# Patient Record
Sex: Male | Born: 2000 | Race: Black or African American | Hispanic: No | Marital: Single | Smoking: Never smoker
Health system: Southern US, Community
[De-identification: ages and names within clinical notes are randomized; demographics above are authoritative.]

## PROBLEM LIST (undated history)

## (undated) DIAGNOSIS — J45909 Unspecified asthma, uncomplicated: Secondary | ICD-10-CM

## (undated) HISTORY — PX: OTHER SURGICAL HISTORY: SHX169

## (undated) HISTORY — PX: COSMETIC SURGERY: SHX468

## (undated) HISTORY — PX: EAR TUBE REMOVAL: SHX1486

## (undated) HISTORY — PX: TYMPANOSTOMY TUBE PLACEMENT: SHX32

## (undated) HISTORY — PX: TONSILLECTOMY: SUR1361

---
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
## 2019-01-20 ENCOUNTER — Ambulatory Visit (HOSPITAL_COMMUNITY)
Admission: EM | Admit: 2019-01-20 | Discharge: 2019-01-20 | Disposition: A | Discharge status: Home / Self Care | Payer: Medicaid Other | Attending: Family Medicine | Admitting: Family Medicine

## 2019-01-20 ENCOUNTER — Other Ambulatory Visit: Payer: Self-pay

## 2019-01-20 ENCOUNTER — Encounter (HOSPITAL_COMMUNITY): Payer: Self-pay | Admitting: Emergency Medicine

## 2019-01-20 DIAGNOSIS — M545 Low back pain, unspecified: Secondary | ICD-10-CM

## 2019-01-20 MED ORDER — CYCLOBENZAPRINE HCL 10 MG PO TABS: 10.0000 mg | ORAL_TABLET | Freq: Two times a day (BID) | ORAL | 0 refills | Status: DC | PRN

## 2019-01-20 NOTE — Discharge Instructions (Addendum)
Avoid heavy lifting and bending

## 2019-01-20 NOTE — ED Triage Notes (Addendum)
Patient presents to [facility] for assessment of lower back pain x 3 weeks.  States he began working at [employer] in early August, and thinks it might be that.  Patient states the first time he experience it was when he went to sit up from relaxing on the couch, and has felt it intermittently since then.

## 2019-01-20 NOTE — ED Provider Notes (Signed)
[facility]    CSN: [id number] Arrival date & time: 01/20/19  1826      History   Chief Complaint Chief Complaint  Patient presents with  . Back Pain    HPI Scott Sloan is a 18 y.o. male.   Patient had onset of low back pain after beginning current job at [employer].  He cannot relate any specific item he was lifting that caused the pain but is more an accumulative situation and gradual onset of low back pain.  Denies radiation to legs.  There is no prior history of back injury or back pain.  HPI  Past Medical History:  Diagnosis Date  . Asthma     There are no active problems to display for this patient.   Past Surgical History:  Procedure Laterality Date  . EAR TUBE REMOVAL         Home Medications    Prior to Admission medications   Medication Sig Start Date End Date Taking? Authorizing Provider  albuterol (PROVENTIL HFA;VENTOLIN HFA) 108 (90 Base) MCG/ACT inhaler Inhale 1-2 puffs into the lungs every 6 (six) hours as needed for wheezing or shortness of breath. 07/24/17   Azalia Bilis, MD  azithromycin (ZITHROMAX Z-PAK) 250 MG tablet Take 1 tablet (250 mg total) by mouth daily. Take 2 tabs for first dose, then 1 tab for each additional dose 07/24/17   Azalia Bilis, MD    Family History Family History  Problem Relation Age of Onset  . Heart attack Mother   . CVA Mother   . Diabetes type II Mother   . Hypertension Mother   . Pneumonia Father     Social History Social History   Tobacco Use  . Smoking status: Never Smoker  . Smokeless tobacco: Never Used  Substance Use Topics  . Alcohol use: No  . Drug use: No     Allergies   Amoxicillin   Review of Systems Review of Systems  Musculoskeletal: Positive for back pain. Negative for arthralgias and gait problem.  All other systems reviewed and are negative.    Physical Exam Triage Vital Signs ED Triage Vitals  Enc Vitals Group     BP 01/20/19 1935 128/75     Pulse Rate 01/20/19  1935 72     Resp 01/20/19 1935 16     Temp 01/20/19 1935 98.3 F (36.8 C)     Temp Source 01/20/19 1935 Tympanic     SpO2 01/20/19 1935 97 %     Weight --      Height --      Head Circumference --      Peak Flow --      Pain Score 01/20/19 1936 2     Pain Loc --      Pain Edu? --      Excl. in [place]? --    No data found.  Updated Vital Signs BP 128/75 (BP Location: Left Arm)   Pulse 72   Temp 98.3 F (36.8 C) (Tympanic)   Resp 16   SpO2 97%   Visual Acuity Right Eye Distance:   Left Eye Distance:   Bilateral Distance:    Right Eye Near:   Left Eye Near:    Bilateral Near:     Physical Exam Constitutional:      Appearance: Normal appearance. He is normal weight.  Cardiovascular:     Rate and Rhythm: Normal rate.  Pulmonary:     Effort: Pulmonary effort is normal.  Breath sounds: Normal breath sounds.  Abdominal:     General: Abdomen is flat. Bowel sounds are normal.  Musculoskeletal:     Comments: Back: Slight decrease in flexion and extension but lateral bending is normal. Straight leg raising is negative Deep tendon reflexes are symmetric in lower extremities Normal gait and toe walking  Neurological:     Mental Status: He is alert.      UC Treatments / Results  Labs (all labs ordered are listed, but only abnormal results are displayed) Labs Reviewed - No data to display  EKG   Radiology No results found.  Procedures Procedures (including critical care time)  Medications Ordered in UC Medications - No data to display  Initial Impression / Assessment and Plan / UC Course  I have reviewed the triage vital signs and the nursing notes.  Pertinent labs & imaging results that were available during my care of the patient were reviewed by me and considered in my medical decision making (see chart for details).     Mechanical low back strain.  Have advised him to decrease lifting and bending until problem resolves Final Clinical Impressions(s) /  UC Diagnoses   Final diagnoses:  None   Discharge Instructions   None    ED Prescriptions    None     PDMP not reviewed this encounter.   Wardell Honour, MD 01/20/19 [phone]

## 2019-07-17 ENCOUNTER — Ambulatory Visit (HOSPITAL_COMMUNITY)
Admission: EM | Admit: 2019-07-17 | Discharge: 2019-07-17 | Disposition: A | Discharge status: Home / Self Care | Payer: Medicaid Other | Attending: Family Medicine | Admitting: Family Medicine

## 2019-07-17 ENCOUNTER — Encounter (HOSPITAL_COMMUNITY): Payer: Self-pay | Admitting: *Deleted

## 2019-07-17 ENCOUNTER — Other Ambulatory Visit: Payer: Self-pay

## 2019-07-17 DIAGNOSIS — S6991XA Unspecified injury of right wrist, hand and finger(s), initial encounter: Secondary | ICD-10-CM

## 2019-07-17 DIAGNOSIS — Z23 Encounter for immunization: Secondary | ICD-10-CM | POA: Diagnosis not present

## 2019-07-17 DIAGNOSIS — S61232A Puncture wound without foreign body of right middle finger without damage to nail, initial encounter: Secondary | ICD-10-CM | POA: Diagnosis not present

## 2019-07-17 DIAGNOSIS — W540XXA Bitten by dog, initial encounter: Secondary | ICD-10-CM | POA: Diagnosis not present

## 2019-07-17 DIAGNOSIS — S61214A Laceration without foreign body of right ring finger without damage to nail, initial encounter: Secondary | ICD-10-CM | POA: Diagnosis not present

## 2019-07-17 MED ORDER — TETANUS-DIPHTH-ACELL PERTUSSIS 5-2.5-18.5 LF-MCG/0.5 IM SUSP
INTRAMUSCULAR | Status: AC
  Filled 2019-07-17: qty 0.5

## 2019-07-17 MED ORDER — TETANUS-DIPHTH-ACELL PERTUSSIS 5-2.5-18.5 LF-MCG/0.5 IM SUSP
0.5000 mL | Freq: Once | INTRAMUSCULAR | Status: AC
  Administered 2019-07-17: 19:00:00 0.5 mL via INTRAMUSCULAR

## 2019-07-17 MED ORDER — AMOXICILLIN-POT CLAVULANATE 875-125 MG PO TABS: 1.0000 | ORAL_TABLET | Freq: Two times a day (BID) | ORAL | 0 refills | Status: DC

## 2019-07-17 NOTE — Discharge Instructions (Addendum)
Treating prophylactically for infection with amoxicillin. Tetanus shot updated. Please follow-up for any continued or worsening problems.

## 2019-07-17 NOTE — ED Triage Notes (Signed)
Pt reports he reached under bed to get his dog out when it bit him @ 1300 today.  States dog is UTD on rabies vaccines.  Small laceration and puncture wound to anterior right middle finger, small superficial laceration to right anterior 4th finger and small avulsion to right 2nd finger.  All sites without any active bleeding.  Pt has cleansed all wounds with soap/water.
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
## 2019-11-02 ENCOUNTER — Encounter (HOSPITAL_COMMUNITY): Payer: Self-pay

## 2019-11-02 ENCOUNTER — Emergency Department (HOSPITAL_COMMUNITY)
Admission: EM | Admit: 2019-11-02 | Discharge: 2019-11-02 | Disposition: A | Discharge status: Home / Self Care | Payer: Medicaid Other | Attending: Emergency Medicine | Admitting: Emergency Medicine

## 2019-11-02 DIAGNOSIS — Y939 Activity, unspecified: Secondary | ICD-10-CM | POA: Insufficient documentation

## 2019-11-02 DIAGNOSIS — S01111A Laceration without foreign body of right eyelid and periocular area, initial encounter: Secondary | ICD-10-CM | POA: Diagnosis present

## 2019-11-02 DIAGNOSIS — W228XXA Striking against or struck by other objects, initial encounter: Secondary | ICD-10-CM | POA: Diagnosis not present

## 2019-11-02 DIAGNOSIS — Y999 Unspecified external cause status: Secondary | ICD-10-CM | POA: Insufficient documentation

## 2019-11-02 DIAGNOSIS — Z5321 Procedure and treatment not carried out due to patient leaving prior to being seen by health care provider: Secondary | ICD-10-CM | POA: Diagnosis not present

## 2019-11-02 DIAGNOSIS — Y929 Unspecified place or not applicable: Secondary | ICD-10-CM | POA: Diagnosis not present

## 2019-11-02 NOTE — ED Notes (Signed)
Pt called no answer 

## 2019-11-02 NOTE — ED Notes (Signed)
Pt did not answer x 2 
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
## 2019-11-04 ENCOUNTER — Emergency Department (HOSPITAL_COMMUNITY): Payer: Medicaid Other

## 2019-11-04 ENCOUNTER — Emergency Department (HOSPITAL_COMMUNITY)
Admission: EM | Admit: 2019-11-04 | Discharge: 2019-11-04 | Disposition: A | Discharge status: Home / Self Care | Payer: Medicaid Other | Attending: Emergency Medicine | Admitting: Emergency Medicine

## 2019-11-04 ENCOUNTER — Encounter (HOSPITAL_COMMUNITY): Payer: Self-pay

## 2019-11-04 DIAGNOSIS — Z7951 Long term (current) use of inhaled steroids: Secondary | ICD-10-CM | POA: Insufficient documentation

## 2019-11-04 DIAGNOSIS — Y939 Activity, unspecified: Secondary | ICD-10-CM | POA: Diagnosis not present

## 2019-11-04 DIAGNOSIS — Y999 Unspecified external cause status: Secondary | ICD-10-CM | POA: Diagnosis not present

## 2019-11-04 DIAGNOSIS — W228XXA Striking against or struck by other objects, initial encounter: Secondary | ICD-10-CM | POA: Insufficient documentation

## 2019-11-04 DIAGNOSIS — J45909 Unspecified asthma, uncomplicated: Secondary | ICD-10-CM | POA: Diagnosis not present

## 2019-11-04 DIAGNOSIS — S0990XA Unspecified injury of head, initial encounter: Secondary | ICD-10-CM | POA: Diagnosis present

## 2019-11-04 DIAGNOSIS — Y929 Unspecified place or not applicable: Secondary | ICD-10-CM | POA: Insufficient documentation

## 2019-11-04 DIAGNOSIS — S060X0A Concussion without loss of consciousness, initial encounter: Secondary | ICD-10-CM | POA: Diagnosis not present

## 2019-11-04 MED ORDER — ACETAMINOPHEN 325 MG PO TABS
650.0000 mg | ORAL_TABLET | Freq: Once | ORAL | Status: AC
  Administered 2019-11-04: 650 mg via ORAL
  Filled 2019-11-04: qty 2

## 2019-11-04 MED ORDER — SODIUM CHLORIDE 0.9 % IV BOLUS: 1000.0000 mL | Freq: Once | INTRAVENOUS | Status: DC
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
## 2019-11-04 NOTE — Discharge Instructions (Addendum)
You are seen today for concussion syndrome, I want you to use the attached instructions.  I want you to take Tylenol as prescribed on the bottle for pain.  If you start developing any severe headache, vision changes, weakness, trouble walking, difficulty urinating I want you to come back to the emergency department.  I want you to follow-up with your primary care in the next couple of days if you do not start to feel better.  I do not want you to drink alcohol over the next couple of days and avoid driving.  If you have any new or worsening concerning symptoms please come back to the emergency department.
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
## 2020-02-09 ENCOUNTER — Encounter (HOSPITAL_COMMUNITY): Payer: Self-pay | Admitting: *Deleted

## 2020-02-09 ENCOUNTER — Other Ambulatory Visit: Payer: Self-pay

## 2020-02-09 ENCOUNTER — Ambulatory Visit (HOSPITAL_COMMUNITY)
Admission: EM | Admit: 2020-02-09 | Discharge: 2020-02-09 | Disposition: A | Discharge status: Home / Self Care | Payer: Medicaid Other | Attending: Internal Medicine | Admitting: Internal Medicine

## 2020-02-09 ENCOUNTER — Telehealth (HOSPITAL_COMMUNITY): Payer: Self-pay | Admitting: Internal Medicine

## 2020-02-09 DIAGNOSIS — R5383 Other fatigue: Secondary | ICD-10-CM | POA: Insufficient documentation

## 2020-02-09 LAB — CBC WITH DIFFERENTIAL/PLATELET
Abs Immature Granulocytes: 0.02 10*3/uL (ref 0.00–0.07)
Basophils Absolute: 0 10*3/uL (ref 0.0–0.1)
Basophils Relative: 0 %
Eosinophils Absolute: 0.1 10*3/uL (ref 0.0–0.5)
Eosinophils Relative: 2 %
HCT: 48.4 % (ref 39.0–52.0)
Hemoglobin: 15.7 g/dL (ref 13.0–17.0)
Immature Granulocytes: 0 %
Lymphocytes Relative: 18 %
Lymphs Abs: 1.5 10*3/uL (ref 0.7–4.0)
MCH: 29.5 pg (ref 26.0–34.0)
MCHC: 32.4 g/dL (ref 30.0–36.0)
MCV: 91 fL (ref 80.0–100.0)
Monocytes Absolute: 0.6 10*3/uL (ref 0.1–1.0)
Monocytes Relative: 7 %
Neutro Abs: 6.1 10*3/uL (ref 1.7–7.7)
Neutrophils Relative %: 73 %
Platelets: 363 10*3/uL (ref 150–400)
RBC: 5.32 MIL/uL (ref 4.22–5.81)
RDW: 13.2 % (ref 11.5–15.5)
WBC: 8.4 10*3/uL (ref 4.0–10.5)
nRBC: 0 % (ref 0.0–0.2)

## 2020-02-09 LAB — COMPREHENSIVE METABOLIC PANEL
ALT: 15 U/L (ref 0–44)
AST: 17 U/L (ref 15–41)
Albumin: 3.9 g/dL (ref 3.5–5.0)
Alkaline Phosphatase: 54 U/L (ref 38–126)
Anion gap: 9 (ref 5–15)
BUN: 5 mg/dL — ABNORMAL LOW (ref 6–20)
CO2: 25 mmol/L (ref 22–32)
Calcium: 9.1 mg/dL (ref 8.9–10.3)
Chloride: 106 mmol/L (ref 98–111)
Creatinine, Ser: 1.06 mg/dL (ref 0.61–1.24)
GFR, Estimated: >60 mL/min (ref >60)
Glucose, Bld: 95 mg/dL (ref 70–99)
Potassium: 3.6 mmol/L (ref 3.5–5.1)
Sodium: 140 mmol/L (ref 135–145)
Total Bilirubin: 0.8 mg/dL (ref 0.3–1.2)
Total Protein: 6.3 g/dL — ABNORMAL LOW (ref 6.5–8.1)

## 2020-02-09 LAB — VITAMIN D 25 HYDROXY (VIT D DEFICIENCY, FRACTURES): Vit D, 25-Hydroxy: 6.56 ng/mL — ABNORMAL LOW (ref 30–100)

## 2020-02-09 LAB — TSH: TSH: 0.948 u[IU]/mL (ref 0.350–4.500)

## 2020-02-09 MED ORDER — CENTRUM PO CHEW: 1.0000 | CHEWABLE_TABLET | Freq: Every day | ORAL | 0 refills | Status: DC

## 2020-02-09 MED ORDER — VITAMIN D (ERGOCALCIFEROL) 1.25 MG (50000 UNIT) PO CAPS: 50000.0000 [IU] | ORAL_CAPSULE | ORAL | 0 refills | Status: DC

## 2020-02-09 NOTE — ED Triage Notes (Signed)
PT reports he has been donating plasma 2x a week and now has chills,abd pain and thinks he is malnouriished.

## 2020-02-09 NOTE — Discharge Instructions (Signed)
Please eat a balanced diet Take OTC multivitamin We will call you with labs if it is abnormal.

## 2020-02-09 NOTE — Telephone Encounter (Signed)
I informed about his vitamin D levels.  He is advised to pick up the vitamin D prescription at the [facility] on [address].

## 2020-02-09 NOTE — ED Provider Notes (Signed)
[facility]    CSN: [id number] Arrival date & time: 02/09/20  1538      History   Chief Complaint Chief Complaint  Patient presents with   Abdominal Pain    HPI Scott Sloan is a 19 y.o. male comes to the urgent care with at least a 2-week history of fatigue.  Patient donates plasma twice a week and he has been on some weight loss regimen resulting in about 40 pound weight loss.  He has decreased appetite.   No nausea or vomiting.  No change in bowel habits.  Patient denies any fever that he endorses feeling cold all the time.  He has some abdominal pain but no abdominal distention.  HPI  Past Medical History:  Diagnosis Date   Asthma     There are no problems to display for this patient.   Past Surgical History:  Procedure Laterality Date   COSMETIC SURGERY     from dog bite   EAR TUBE REMOVAL     hand tumor     TONSILLECTOMY     TYMPANOSTOMY TUBE PLACEMENT         Home Medications    Prior to Admission medications   Medication Sig Start Date End Date Taking? Authorizing Provider  multivitamin-iron-minerals-folic acid (CENTRUM) chewable tablet Chew 1 tablet by mouth daily. 02/09/20   Dametria Tuzzolino, Britta Mccreedy, MD  albuterol (PROVENTIL HFA;VENTOLIN HFA) 108 (90 Base) MCG/ACT inhaler Inhale 1-2 puffs into the lungs every 6 (six) hours as needed for wheezing or shortness of breath. Patient not taking: Reported on 11/04/2019 07/24/17 02/09/20  Azalia Bilis, MD    Family History Family History  Problem Relation Age of Onset   Heart attack Mother    CVA Mother    Diabetes type II Mother    Hypertension Mother    Pneumonia Father     Social History Social History   Tobacco Use   Smoking status: Never Smoker   Smokeless tobacco: Never Used  Building services engineer Use: Never used  Substance Use Topics   Alcohol use: Not Currently   Drug use: No     Allergies   Amoxicillin   Review of Systems Review of Systems    Constitutional: Positive for chills and fatigue. Negative for fever.  HENT: Negative.   Gastrointestinal: Positive for abdominal pain and nausea. Negative for constipation, diarrhea and vomiting.  Genitourinary: Negative.   Musculoskeletal: Negative.  Negative for arthralgias, neck pain and neck stiffness.  Neurological: Positive for weakness and headaches. Negative for dizziness, syncope and light-headedness.     Physical Exam Triage Vital Signs ED Triage Vitals  Enc Vitals Group     BP 02/09/20 1643 128/80     Pulse Rate 02/09/20 1643 100     Resp 02/09/20 1643 20     Temp 02/09/20 1643 98.6 F (37 C)     Temp Source 02/09/20 1643 Oral     SpO2 02/09/20 1643 98 %     Weight 02/09/20 1646 190 lb (86.2 kg)     Height 02/09/20 1646 5\' 8"  (1.727 m)     Head Circumference --      Peak Flow --      Pain Score 02/09/20 1644 4     Pain Loc --      Pain Edu? --      Excl. in GC? --    No data found.  Updated Vital Signs BP 128/80 (BP Location: Left Arm)  Pulse 100    Temp 98.6 F (37 C) (Oral)    Resp 20    Ht 5\' 8"  (1.727 m)    Wt 86.2 kg    SpO2 98%    BMI 28.89 kg/m   Visual Acuity Right Eye Distance:   Left Eye Distance:   Bilateral Distance:    Right Eye Near:   Left Eye Near:    Bilateral Near:     Physical Exam Vitals and nursing note reviewed.  Constitutional:      General: He is not in acute distress.    Appearance: He is not ill-appearing.  Cardiovascular:     Rate and Rhythm: Normal rate and regular rhythm.  Pulmonary:     Effort: Pulmonary effort is normal. No respiratory distress.     Breath sounds: Normal breath sounds. No wheezing or rhonchi.  Abdominal:     General: Bowel sounds are normal.     Palpations: Abdomen is soft. There is no shifting dullness, hepatomegaly or splenomegaly.     Tenderness: There is no abdominal tenderness.     Hernia: No hernia is present.  Neurological:     Mental Status: He is alert.      UC Treatments /  Results  Labs (all labs ordered are listed, but only abnormal results are displayed) Labs Reviewed  COMPREHENSIVE METABOLIC PANEL - Abnormal; Notable for the following components:      Result Value   BUN 5 (*)    Total Protein 6.3 (*)    All other components within normal limits  VITAMIN D 25 HYDROXY (VIT D DEFICIENCY, FRACTURES) - Abnormal; Notable for the following components:   Vit D, 25-Hydroxy 6.56 (*)    All other components within normal limits  CBC WITH DIFFERENTIAL/PLATELET  TSH    EKG   Radiology No results found.  Procedures Procedures (including critical care time)  Medications Ordered in UC Medications - No data to display  Initial Impression / Assessment and Plan / UC Course  I have reviewed the triage vital signs and the nursing notes.  Pertinent labs & imaging results that were available during my care of the patient were reviewed by me and considered in my medical decision making (see chart for details).     1.  Fatigue (secondary to vitamin D deficiency): CBC, BMP and TSH are unremarkable Vitamin D level is 6.5 We will send vitamin D 50,000 units weekly for 6 weeks followed by 1000 units daily Multivitamin recommended Return precautions given. Final Clinical Impressions(s) / UC Diagnoses   Final diagnoses:  Fatigue, unspecified type     Discharge Instructions     Please eat a balanced diet Take OTC multivitamin We will call you with labs if it is abnormal.   ED Prescriptions    Medication Sig Dispense Auth. Provider   multivitamin-iron-minerals-folic acid (CENTRUM) chewable tablet Chew 1 tablet by mouth daily. 90 tablet Andreanna Mikolajczak, , MD     PDMP not reviewed this encounter.   Britta Mccreedy, MD 02/09/20 [phone]

## 2020-05-11 ENCOUNTER — Encounter (HOSPITAL_COMMUNITY): Payer: Self-pay

## 2020-05-11 ENCOUNTER — Other Ambulatory Visit: Payer: Self-pay

## 2020-05-11 DIAGNOSIS — U071 COVID-19: Secondary | ICD-10-CM | POA: Diagnosis not present

## 2020-05-11 DIAGNOSIS — J45909 Unspecified asthma, uncomplicated: Secondary | ICD-10-CM | POA: Insufficient documentation

## 2020-05-11 DIAGNOSIS — R509 Fever, unspecified: Secondary | ICD-10-CM | POA: Diagnosis present

## 2020-05-11 MED ORDER — IBUPROFEN 800 MG PO TABS
800.0000 mg | ORAL_TABLET | Freq: Once | ORAL | Status: AC
  Administered 2020-05-11: 800 mg via ORAL
  Filled 2020-05-11: qty 1

## 2020-05-11 NOTE — ED Triage Notes (Signed)
Pt c/o HA, sore throat, body aches, cough starting at 7am. Took 500mg  tylenol @ 6pm. Denies known covid exposure

## 2020-05-12 ENCOUNTER — Emergency Department (HOSPITAL_COMMUNITY)
Admission: EM | Admit: 2020-05-12 | Discharge: 2020-05-12 | Disposition: A | Discharge status: Home / Self Care | Payer: Medicaid Other | Attending: Emergency Medicine | Admitting: Emergency Medicine

## 2020-05-12 DIAGNOSIS — B349 Viral infection, unspecified: Secondary | ICD-10-CM

## 2020-05-12 LAB — SARS CORONAVIRUS 2 (TAT 6-24 HRS): SARS Coronavirus 2: POSITIVE — AB

## 2020-05-12 NOTE — Discharge Instructions (Addendum)
If you test positive for COVID-19  You can end isolation after 5 days if you are fever free for 24 hours and your symptoms are improving.  Wear a mask at home and in public for additional 5 days.   If you continue to have fever, you should wait to end your isolation until you are fever free for 24 hours and you overall improving.  For members of your household or close contacts, they should quarantine if they have not been vaccinated or unable to be vaccinated.  They should wear a mask when around others.  They can be tested at the 5 day mark after last close contact with someone with COVID-19

## 2020-05-12 NOTE — ED Provider Notes (Signed)
[facility]-EMERGENCY DEPT Provider Note   CSN: [id number] Arrival date & time: 05/11/20  2015     History Chief Complaint  Patient presents with  . Fever    Scott Sloan is a 20 y.o. male.  The history is provided by the patient.  Fever Severity:  Moderate Onset quality:  Sudden Duration:  1 day Timing:  Constant Progression:  Worsening Chronicity:  New Relieved by:  Ibuprofen Worsened by:  Nothing Associated symptoms: chills, cough, headaches and myalgias   Associated symptoms: no diarrhea, no rash and no vomiting    Patient is an otherwise healthy male who presents with viral illness.  He has had fever/headache/cough/myalgias. He is unvaccinated for COVID-19.    Past Medical History:  Diagnosis Date  . Asthma     There are no problems to display for this patient.   Past Surgical History:  Procedure Laterality Date  . COSMETIC SURGERY     from dog bite  . EAR TUBE REMOVAL    . hand tumor    . TONSILLECTOMY    . TYMPANOSTOMY TUBE PLACEMENT         Family History  Problem Relation Age of Onset  . Heart attack Mother   . CVA Mother   . Diabetes type II Mother   . Hypertension Mother   . Pneumonia Father     Social History   Tobacco Use  . Smoking status: Never Smoker  . Smokeless tobacco: Never Used  Vaping Use  . Vaping Use: Never used  Substance Use Topics  . Alcohol use: Not Currently  . Drug use: No    Home Medications Prior to Admission medications   Medication Sig Start Date End Date Taking? Authorizing Provider  multivitamin-iron-minerals-folic acid (CENTRUM) chewable tablet Chew 1 tablet by mouth daily. 02/09/20   Lamptey, Britta Mccreedy, MD  Vitamin D, Ergocalciferol, (DRISDOL) 1.25 MG (50000 UNIT) CAPS capsule Take 1 capsule (50,000 Units total) by mouth every 7 (seven) days. 02/09/20   Lamptey, Britta Mccreedy, MD  albuterol (PROVENTIL HFA;VENTOLIN HFA) 108 (90 Base) MCG/ACT inhaler Inhale 1-2 puffs into the lungs  every 6 (six) hours as needed for wheezing or shortness of breath. Patient not taking: Reported on 11/04/2019 07/24/17 02/09/20  Azalia Bilis, MD    Allergies    Amoxicillin  Review of Systems   Review of Systems  Constitutional: Positive for chills, fatigue and fever.  Respiratory: Positive for cough.   Gastrointestinal: Negative for diarrhea and vomiting.  Musculoskeletal: Positive for myalgias.  Skin: Negative for rash.  Neurological: Positive for headaches.  All other systems reviewed and are negative.   Physical Exam Updated Vital Signs BP 121/67 (BP Location: Left Arm)   Pulse (!) 126   Temp (!) 102 F (38.9 C) (Oral)   Resp 16   Ht 1.727 m (5\' 8" )   Wt 86.2 kg   SpO2 100%   BMI 28.89 kg/m   Physical Exam CONSTITUTIONAL: Well developed/well nourished HEAD: Normocephalic/atraumatic EYES: EOMI/PERRL NECK: supple no meningeal signs SPINE/BACK:entire spine nontender CV: S1/S2 noted, no murmurs/rubs/gallops noted LUNGS: Lungs are clear to auscultation bilaterally, no apparent distress ABDOMEN: soft, nontender, no rebound or guarding, bowel sounds noted throughout abdomen GU:no cva tenderness NEURO: Pt is awake/alert/appropriate, moves all extremitiesx4.  No facial droop.   EXTREMITIES: pulses normal/equal, full ROM SKIN: warm, color normal PSYCH: no abnormalities of mood noted, alert and oriented to situation  ED Results / Procedures / Treatments   Labs (all labs ordered  are listed, but only abnormal results are displayed) Labs Reviewed  SARS CORONAVIRUS 2 (TAT 6-24 HRS)    EKG None  Radiology No results found.  Procedures Procedures  Medications Ordered in ED Medications  ibuprofen (ADVIL) tablet 800 mg (800 mg Oral Given 05/11/20 2036)    ED Course  I have reviewed the triage vital signs and the nursing notes.    MDM Rules/Calculators/A&P                          Patient presents with viral illness which is likely COVID-19.  He is  well-appearing, no meningeal signs  Scott Sloan was evaluated in Emergency Department on 05/12/2020 for the symptoms described in the history of present illness. He was evaluated in the context of the global COVID-19 pandemic, which necessitated consideration that the patient might be at risk for infection with the SARS-CoV-2 virus that causes COVID-19. Institutional protocols and algorithms that pertain to the evaluation of patients at risk for COVID-19 are in a state of rapid change based on information released by regulatory bodies including the CDC and federal and state organizations. These policies and algorithms were followed during the patient's care in the ED.  Final Clinical Impression(s) / ED Diagnoses Final diagnoses:  Viral illness    Rx / DC Orders ED Discharge Orders    None       Zadie Rhine, MD 05/12/20 [phone]

## 2020-07-20 ENCOUNTER — Ambulatory Visit (HOSPITAL_COMMUNITY)
Admission: EM | Admit: 2020-07-20 | Discharge: 2020-07-20 | Disposition: A | Discharge status: Home / Self Care | Payer: Medicaid Other | Attending: Internal Medicine | Admitting: Internal Medicine

## 2020-07-20 ENCOUNTER — Encounter (HOSPITAL_COMMUNITY): Payer: Self-pay | Admitting: Emergency Medicine

## 2020-07-20 ENCOUNTER — Other Ambulatory Visit: Payer: Self-pay

## 2020-07-20 DIAGNOSIS — M7918 Myalgia, other site: Secondary | ICD-10-CM | POA: Diagnosis not present

## 2020-07-20 MED ORDER — IBUPROFEN 600 MG PO TABS: 600.0000 mg | ORAL_TABLET | Freq: Four times a day (QID) | ORAL | 0 refills | Status: DC | PRN

## 2020-07-20 MED ORDER — LIDOCAINE 5 % EX PTCH: 1.0000 | MEDICATED_PATCH | CUTANEOUS | 0 refills | Status: DC

## 2020-07-20 NOTE — Discharge Instructions (Signed)
Gentle range of motion exercises Heating pad will be helpful If symptoms worsen please return to the urgent care.

## 2020-07-20 NOTE — ED Triage Notes (Signed)
Pt presents with mid back pain on right side. 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

## 2020-07-20 NOTE — ED Provider Notes (Signed)
[facility]    CSN: [id number] Arrival date & time: 07/20/20  1447      History   Chief Complaint Chief Complaint  Patient presents with  . Back Pain    HPI Scott Sloan is a 20 y.o. male with a history of asthma comes to urgent care with complaints of mid back pain over the past week.  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  A week or so ago he experienced sharp pain back after lifting a package.  He remembers turning too fast when he lifted the package.  He describes the pain as throbbing mainly on the right side of the mid back.  Pain is of moderate severity.  Aggravated by movement.  He has not tried any over-the-counter remedies.  Patient otherwise denies any trauma or falls.   HPI  Past Medical History:  Diagnosis Date  . Asthma     There are no problems to display for this patient.   Past Surgical History:  Procedure Laterality Date  . COSMETIC SURGERY     from dog bite  . EAR TUBE REMOVAL    . hand tumor    . TONSILLECTOMY    . TYMPANOSTOMY TUBE PLACEMENT         Home Medications    Prior to Admission medications   Medication Sig Start Date End Date Taking? Authorizing Provider  ibuprofen (ADVIL) 600 MG tablet Take 1 tablet (600 mg total) by mouth every 6 (six) hours as needed. 07/20/20  Yes Shedrick Sarli, Britta Mccreedy, MD  lidocaine (LIDODERM) 5 % Place 1 patch onto the skin daily. Remove & Discard patch within 12 hours or as directed by MD 07/20/20  Yes Mace Weinberg, Britta Mccreedy, MD  multivitamin-iron-minerals-folic acid (CENTRUM) chewable tablet Chew 1 tablet by mouth daily. 02/09/20   Consuelo Thayne, Britta Mccreedy, MD  Vitamin D, Ergocalciferol, (DRISDOL) 1.25 MG (50000 UNIT) CAPS capsule Take 1 capsule (50,000 Units total) by mouth every 7 (seven) days. 02/09/20   Taytum Scheck, Britta Mccreedy, MD  albuterol (PROVENTIL HFA;VENTOLIN HFA) 108 (90 Base) MCG/ACT inhaler Inhale 1-2 puffs into the lungs every 6 (six) hours as  needed for wheezing or shortness of breath. Patient not taking: Reported on 11/04/2019 07/24/17 02/09/20  Azalia Bilis, MD    Family History Family History  Problem Relation Age of Onset  . Heart attack Mother   . CVA Mother   . Diabetes type II Mother   . Hypertension Mother   . Pneumonia Father     Social History Social History   Tobacco Use  . Smoking status: Never Smoker  . Smokeless tobacco: Never Used  Vaping Use  . Vaping Use: Never used  Substance Use Topics  . Alcohol use: Not Currently  . Drug use: No     Allergies   Amoxicillin   Review of Systems Review of Systems  Gastrointestinal: Negative.   Musculoskeletal: Positive for arthralgias and back pain. Negative for neck pain and neck stiffness.  Skin: Negative.   Neurological: Negative.      Physical Exam Triage Vital Signs ED Triage Vitals  Enc Vitals Group     BP 07/20/20 1516 (!) 109/58     Pulse Rate 07/20/20 1516 92     Resp 07/20/20 1516 16     Temp 07/20/20 1516 98.1 F (36.7 C)     Temp Source 07/20/20 1516 Oral     SpO2 07/20/20 1516 100 %  Weight --      Height --      Head Circumference --      Peak Flow --      Pain Score 07/20/20 1514 3     Pain Loc --      Pain Edu? --      Excl. in GC? --    No data found.  Updated Vital Signs BP (!) 109/58 (BP Location: Right Arm)   Pulse 92   Temp 98.1 F (36.7 C) (Oral)   Resp 16   SpO2 100%   Visual Acuity Right Eye Distance:   Left Eye Distance:   Bilateral Distance:    Right Eye Near:   Left Eye Near:    Bilateral Near:     Physical Exam Vitals and nursing note reviewed.  Constitutional:      General: He is not in acute distress.    Appearance: He is not ill-appearing.  Cardiovascular:     Rate and Rhythm: Normal rate and regular rhythm.  Musculoskeletal:        General: Tenderness present. Normal range of motion.     Comments: Tenderness to palpation over the right paraspinal muscles in the subscapular region   Skin:    Capillary Refill: Capillary refill takes less than 2 seconds.  Neurological:     Mental Status: He is alert.      UC Treatments / Results  Labs (all labs ordered are listed, but only abnormal results are displayed) Labs Reviewed - No data to display  EKG   Radiology No results found.  Procedures Procedures (including critical care time)  Medications Ordered in UC Medications - No data to display  Initial Impression / Assessment and Plan / UC Course  I have reviewed the triage vital signs and the nursing notes.  Pertinent labs & imaging results that were available during my care of the patient were reviewed by me and considered in my medical decision making (see chart for details).     1.  Right paraspinal muscle pain: Heating pad application advised Lidoderm patches Ibuprofen 600 mg every 6 hours as needed for pain Gentle range of motion exercises Return to urgent care if symptoms worsen. Final Clinical Impressions(s) / UC Diagnoses   Final diagnoses:  Pain of paraspinal muscle     Discharge Instructions     Gentle range of motion exercises Heating pad will be helpful If symptoms worsen please return to the urgent care.   ED Prescriptions    Medication Sig Dispense Auth. Provider   lidocaine (LIDODERM) 5 % Place 1 patch onto the skin daily. Remove & Discard patch within 12 hours or as directed by MD 30 patch Erby Sanderson, Britta Mccreedy, MD   ibuprofen (ADVIL) 600 MG tablet Take 1 tablet (600 mg total) by mouth every 6 (six) hours as needed. 30 tablet Raha Tennison, Britta Mccreedy, MD     PDMP not reviewed this encounter.   Merrilee Jansky, MD 07/20/20 [phone]

## 2021-01-30 ENCOUNTER — Encounter (HOSPITAL_COMMUNITY): Payer: Self-pay

## 2021-01-30 ENCOUNTER — Other Ambulatory Visit: Payer: Self-pay

## 2021-01-30 ENCOUNTER — Ambulatory Visit (HOSPITAL_COMMUNITY)
Admission: RE | Admit: 2021-01-30 | Discharge: 2021-01-30 | Disposition: A | Discharge status: Home / Self Care | Payer: Medicaid Other | Source: Ambulatory Visit | Attending: Student | Admitting: Student

## 2021-01-30 VITALS — BP 124/66 | HR 81 | Temp 98.2°F | Resp 19

## 2021-01-30 DIAGNOSIS — R590 Localized enlarged lymph nodes: Secondary | ICD-10-CM | POA: Diagnosis not present

## 2021-01-30 DIAGNOSIS — Z113 Encounter for screening for infections with a predominantly sexual mode of transmission: Secondary | ICD-10-CM | POA: Insufficient documentation

## 2021-01-30 DIAGNOSIS — N485 Ulcer of penis: Secondary | ICD-10-CM | POA: Insufficient documentation

## 2021-01-30 LAB — HIV ANTIBODY (ROUTINE TESTING W REFLEX): HIV Screen 4th Generation wRfx: NONREACTIVE

## 2021-01-30 NOTE — Discharge Instructions (Addendum)
-  We have sent testing for sexually transmitted infections: HIV, syphilis, gonorrhea, chlamydia, trichomonas.  We will notify you of any positive results once they are received. If required, we will prescribe any medications you might need. Please refrain from all sexual activity until treatment is complete.  -Seek additional medical attention if you develop fevers/chills, new/worsening abdominal pain, new/worsening penile discomfort/discharge, etc.  -PCP referral placed, they should call you to set this up. They can monitor your vitamin D.

## 2021-01-30 NOTE — ED Triage Notes (Signed)
Pt presents with requests for a follow up on the blood work that was done in the past checking for Vit D. Pt c/o groin swelling and states there is a lump on the L side of the groin. States he has a burn mark on  the L side of his penis X 2 weeks.

## 2021-01-30 NOTE — ED Provider Notes (Signed)
[facility]    CSN: [id number] Arrival date & time: 01/30/21  1141      History   Chief Complaint Chief Complaint  Patient presents with   Appointment    1200    HPI Alwyn D Breau is a 20 y.o. male presenting with inguinal lymphadenopathy, "burn" on penis, and requesting vitamin D blood work.  Medical history asthma as below.  Patient states that he has had about 2 weeks of a "burn mark" on the left tip of his penis, this is minimally tender, though it burns when he urinates.  Also with 2 weeks of progressively worsening inguinal lymphadenopathy, left worse than right.  Denies other symptoms including discharge, abdominal pain, flank pain, fever/chills.  Denies new partners, same male partner.  He denies trauma to the penis, though he thinks he might have gotten the tip caught in a zipper.  Also requesting blood work for vitamin D given fatigue, this was significantly low over 1 year ago at his last visit with [place].  He does not have a primary care.  HPI  Past Medical History:  Diagnosis Date   Asthma     There are no problems to display for this patient.   Past Surgical History:  Procedure Laterality Date   COSMETIC SURGERY     from dog bite   EAR TUBE REMOVAL     hand tumor     TONSILLECTOMY     TYMPANOSTOMY TUBE PLACEMENT         Home Medications    Prior to Admission medications   Medication Sig Start Date End Date Taking? Authorizing Provider  ibuprofen (ADVIL) 600 MG tablet Take 1 tablet (600 mg total) by mouth every 6 (six) hours as needed. 07/20/20   Lamptey, Britta Mccreedy, MD  lidocaine (LIDODERM) 5 % Place 1 patch onto the skin daily. Remove & Discard patch within 12 hours or as directed by MD 07/20/20   Merrilee Jansky, MD  multivitamin-iron-minerals-folic acid (CENTRUM) chewable tablet Chew 1 tablet by mouth daily. 02/09/20   Lamptey, Britta Mccreedy, MD  Vitamin D, Ergocalciferol, (DRISDOL) 1.25 MG (50000 UNIT) CAPS capsule Take 1 capsule (50,000 Units  total) by mouth every 7 (seven) days. 02/09/20   Lamptey, Britta Mccreedy, MD  albuterol (PROVENTIL HFA;VENTOLIN HFA) 108 (90 Base) MCG/ACT inhaler Inhale 1-2 puffs into the lungs every 6 (six) hours as needed for wheezing or shortness of breath. Patient not taking: Reported on 11/04/2019 07/24/17 02/09/20  Azalia Bilis, MD    Family History Family History  Problem Relation Age of Onset   Heart attack Mother    CVA Mother    Diabetes type II Mother    Hypertension Mother    Pneumonia Father     Social History Social History   Tobacco Use   Smoking status: Never   Smokeless tobacco: Never  Vaping Use   Vaping Use: Never used  Substance Use Topics   Alcohol use: Not Currently   Drug use: No     Allergies   Amoxicillin   Review of Systems Review of Systems  Constitutional:  Negative for chills and fever.  HENT:  Negative for sore throat.   Eyes:  Negative for pain and redness.  Respiratory:  Negative for shortness of breath.   Cardiovascular:  Negative for chest pain.  Gastrointestinal:  Negative for abdominal pain, diarrhea, nausea and vomiting.  Genitourinary:  Positive for penile pain. Negative for decreased urine volume, difficulty urinating, dysuria, flank pain, frequency, genital  sores, hematuria, penile discharge, penile swelling, scrotal swelling, testicular pain and urgency.  Musculoskeletal:  Negative for back pain.  Skin:  Negative for rash.  All other systems reviewed and are negative.   Physical Exam Triage Vital Signs ED Triage Vitals  Enc Vitals Group     BP 01/30/21 1213 124/66     Pulse Rate 01/30/21 1213 81     Resp 01/30/21 1213 19     Temp 01/30/21 1213 98.2 F (36.8 C)     Temp Source 01/30/21 1213 Oral     SpO2 01/30/21 1213 100 %     Weight --      Height --      Head Circumference --      Peak Flow --      Pain Score 01/30/21 1214 4     Pain Loc --      Pain Edu? --      Excl. in GC? --    No data found.  Updated Vital Signs BP 124/66  (BP Location: Right Arm)   Pulse 81   Temp 98.2 F (36.8 C) (Oral)   Resp 19   SpO2 100%   Visual Acuity Right Eye Distance:   Left Eye Distance:   Bilateral Distance:    Right Eye Near:   Left Eye Near:    Bilateral Near:     Physical Exam Vitals reviewed. Exam conducted with a chaperone present.  Constitutional:      General: He is not in acute distress.    Appearance: Normal appearance. He is not ill-appearing.  HENT:     Head: Normocephalic and atraumatic.     Mouth/Throat:     Mouth: Mucous membranes are moist.     Comments: Moist mucous membranes Eyes:     Extraocular Movements: Extraocular movements intact.     Pupils: Pupils are equal, round, and reactive to light.  Cardiovascular:     Rate and Rhythm: Normal rate and regular rhythm.     Heart sounds: Normal heart sounds.  Pulmonary:     Effort: Pulmonary effort is normal.     Breath sounds: Normal breath sounds. No wheezing, rhonchi or rales.  Abdominal:     General: Bowel sounds are normal. There is no distension.     Palpations: Abdomen is soft. There is no mass.     Tenderness: There is no abdominal tenderness. There is no right CVA tenderness, left CVA tenderness, guarding or rebound.  Genitourinary:    Penis: Uncircumcised.      Testes: Normal.     Epididymis:     Right: Normal.     Left: Normal.     Comments: Chaperone: Adriana CMA  Bilateral inguinal lymphadenopathy L>R. 1cm ulceration L tip of penis, minimally tender to palpation. No discharge. No other penile or testicular pain or swelling. Skin:    General: Skin is warm.     Capillary Refill: Capillary refill takes less than 2 seconds.     Comments: Good skin turgor  Neurological:     General: No focal deficit present.     Mental Status: He is alert and oriented to person, place, and time.  Psychiatric:        Mood and Affect: Mood normal.        Behavior: Behavior normal.     UC Treatments / Results  Labs (all labs ordered are listed,  but only abnormal results are displayed) Labs Reviewed  RPR  HIV ANTIBODY (ROUTINE TESTING W REFLEX)  CYTOLOGY, (ORAL, ANAL, URETHRAL) ANCILLARY ONLY    EKG   Radiology No results found.  Procedures Procedures (including critical care time)  Medications Ordered in UC Medications - No data to display  Initial Impression / Assessment and Plan / UC Course  I have reviewed the triage vital signs and the nursing notes.  Pertinent labs & imaging results that were available during my care of the patient were reviewed by me and considered in my medical decision making (see chart for details).     This patient is a very pleasant 20 y.o. year old male presenting with inguinal lymphadenopathy and chancre. Afebrile, nontachycardic, no reproducible abd pain or CVAT.  Denies STI risk; I suspect syphilis. I collected swab for G/C, trich, yeast, BV testing. Also sent HIV, RPR. Safe sex precautions.   Chart review shows history vitamin D deficiency 01/2020; patient requesting recheck today.  Discussed that this needs to be monitored by a primary care provider, as he does not have one I sent a referral for this.  ED return precautions discussed. Patient verbalizes understanding and agreement.   Coding Level 4 for review of past notes/labs, order and interpretation of labs today, and prescription drug management pending labwork.   Final Clinical Impressions(s) / UC Diagnoses   Final diagnoses:  Inguinal lymphadenopathy  Routine screening for STI (sexually transmitted infection)  Penile ulcer     Discharge Instructions      -We have sent testing for sexually transmitted infections: HIV, syphilis, gonorrhea, chlamydia, trichomonas.  We will notify you of any positive results once they are received. If required, we will prescribe any medications you might need. Please refrain from all sexual activity until treatment is complete.  -Seek additional medical attention if you develop  fevers/chills, new/worsening abdominal pain, new/worsening penile discomfort/discharge, etc.  -PCP referral placed, they should call you to set this up. They can monitor your vitamin D.       ED Prescriptions   None    PDMP not reviewed this encounter.   Rhys Martini, PA-C 01/30/21 1245

## 2021-01-31 LAB — CYTOLOGY, (ORAL, ANAL, URETHRAL) ANCILLARY ONLY
Chlamydia: POSITIVE — AB
Comment: NEGATIVE
Comment: NEGATIVE
Comment: NORMAL
Neisseria Gonorrhea: NEGATIVE
Trichomonas: NEGATIVE

## 2021-01-31 LAB — RPR
RPR Ser Ql: REACTIVE — AB
RPR Titer: 1:32 {titer}

## 2021-02-01 ENCOUNTER — Telehealth (HOSPITAL_COMMUNITY): Payer: Self-pay | Admitting: Emergency Medicine

## 2021-02-01 LAB — T.PALLIDUM AB, TOTAL: T Pallidum Abs: REACTIVE — AB

## 2021-02-01 MED ORDER — DOXYCYCLINE HYCLATE 100 MG PO CAPS: 100.0000 mg | ORAL_CAPSULE | Freq: Two times a day (BID) | ORAL | 0 refills | Status: DC

## 2021-02-01 NOTE — Telephone Encounter (Signed)
Per protocol, patient will need treatment with doxycycline.  Will also need treatment with 2.4 million units of Bicillin for positive Syphilis.   Attempted to reach patient x 1, VM is not setup [facility] notified Prescription sent to pharmacy on file

## 2021-02-02 ENCOUNTER — Other Ambulatory Visit: Payer: Self-pay

## 2021-02-02 ENCOUNTER — Ambulatory Visit (HOSPITAL_COMMUNITY)
Admission: RE | Admit: 2021-02-02 | Discharge: 2021-02-02 | Disposition: A | Discharge status: Home / Self Care | Payer: Medicaid Other | Source: Ambulatory Visit | Attending: Medical Oncology | Admitting: Medical Oncology

## 2021-02-02 MED ORDER — DOXYCYCLINE HYCLATE 100 MG PO CAPS: 100.0000 mg | ORAL_CAPSULE | Freq: Two times a day (BID) | ORAL | 0 refills | Status: AC

## 2021-02-02 NOTE — ED Provider Notes (Signed)
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 He is allergic to Amoxil. We discussed extending his Doxycyline to 14 days per UTD. He is tolerating well.    Rushie Chestnut, [place] 02/02/21 1322

## 2021-02-02 NOTE — ED Triage Notes (Signed)
Pt presents for Syphilis treatment. Denies concerns.

## 2021-02-07 ENCOUNTER — Telehealth (HOSPITAL_COMMUNITY): Payer: Self-pay

## 2021-02-07 NOTE — Telephone Encounter (Signed)
Staff from [facility] called requesting verification of treatment.

## 2021-11-07 ENCOUNTER — Encounter (HOSPITAL_COMMUNITY): Payer: Self-pay

## 2021-11-07 ENCOUNTER — Ambulatory Visit (HOSPITAL_COMMUNITY)
Admission: RE | Admit: 2021-11-07 | Discharge: 2021-11-07 | Disposition: A | Discharge status: Home / Self Care | Payer: Medicaid Other | Source: Ambulatory Visit | Attending: Internal Medicine | Admitting: Internal Medicine

## 2021-11-07 VITALS — BP 129/81 | HR 85 | Temp 98.1°F | Resp 14

## 2021-11-07 DIAGNOSIS — R319 Hematuria, unspecified: Secondary | ICD-10-CM | POA: Diagnosis not present

## 2021-11-07 DIAGNOSIS — Z113 Encounter for screening for infections with a predominantly sexual mode of transmission: Secondary | ICD-10-CM

## 2021-11-07 LAB — RPR
RPR Ser Ql: REACTIVE — AB
RPR Titer: 1:1 {titer}

## 2021-11-07 LAB — POCT URINALYSIS DIPSTICK, ED / UC
Bilirubin Urine: NEGATIVE
Glucose, UA: NEGATIVE mg/dL
Ketones, ur: 15 mg/dL — AB
Leukocytes,Ua: NEGATIVE
Nitrite: NEGATIVE
Protein, ur: NEGATIVE mg/dL
Specific Gravity, Urine: 1.02 (ref 1.005–1.030)
Urobilinogen, UA: 0.2 mg/dL (ref 0.0–1.0)
pH: 7.5 (ref 5.0–8.0)

## 2021-11-07 LAB — HIV ANTIBODY (ROUTINE TESTING W REFLEX): HIV Screen 4th Generation wRfx: NONREACTIVE

## 2021-11-07 NOTE — ED Triage Notes (Signed)
Pt presents with request for Vit D, iron, chlamydia, and syphilis recheck. Pt denies having a PCP.

## 2021-11-07 NOTE — Discharge Instructions (Addendum)
Avoid sexual intercourse for next few days until your STI testing comes back.  If anything on your STI results comes back positive, you will need to avoid intercourse for 1 week to allow time for treatment.   Go to [facility] [email or web address] to schedule an appointment with a primary care provider.  I have also initiated PCP assistance for you today where someone will be reaching out to you to help you find a primary care provider.  You may purchase over-the-counter vitamin D and take 2000 IU/day.   Your urinalysis shows that you are mildly dehydrated with some blood in the urine.  This may be related to a possible STI.  We will treat you for any STIs that you test positive for based on protocol when your results come back.   If you develop any new or worsening symptoms or do not improve in the next 2 to 3 days, please return.  If your symptoms are severe, please go to the emergency room.  Follow-up with your primary care provider for further evaluation and management of your symptoms as well as ongoing wellness visits.  I hope you feel better!

## 2021-11-07 NOTE — ED Provider Notes (Signed)
[facility]    CSN: [id number] Arrival date & time: 11/07/21  6237      History   Chief Complaint Chief Complaint  Patient presents with   Exposure to STD    Iron and vitamin d levels are low , I need to re test them again and get another prescription/referral - Entered by patient    HPI Scott Sloan is a 21 y.o. male.   Patient presents urgent care for STI testing and is requesting vitamin D and iron levels to be drawn today.  Patient states that in 2021, he had an extreme vitamin D deficiency where he had to be placed on prescription level vitamin D and he is beginning to feel the same fatigue that he felt when he was diagnosed back in 2021 with a vitamin D deficiency.  He is not currently taking supplemental vitamin D over-the-counter.  Denies shortness of breath, chest pain, dizziness, nausea, vomiting, abdominal pain, and headache.  No constipation or diarrhea reported.  He is also requesting STI testing today.  He states that he tested positive for chlamydia approximately 6 to 12 months ago and would like to ensure that he is negative.  Denies recent new sexual partners.  Reports intermittent penile discharge that is clear with streaks of blood.  He is unsure if this is blood in his urine or related to penile discharge.  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  Denies urinary frequency, urgency, hesitancy, and dysuria.  He has not attempted use of any over-the-counter medications for his symptoms prior to arriving at urgent care.   Exposure to STD    Past Medical History:  Diagnosis Date   Asthma     There are no problems to display for this patient.   Past Surgical History:  Procedure Laterality Date   COSMETIC SURGERY     from dog bite   EAR TUBE REMOVAL     hand tumor     TONSILLECTOMY     TYMPANOSTOMY TUBE PLACEMENT         Home Medications    Prior to Admission medications   Medication Sig  Start Date End Date Taking? Authorizing Provider  albuterol (PROVENTIL HFA;VENTOLIN HFA) 108 (90 Base) MCG/ACT inhaler Inhale 1-2 puffs into the lungs every 6 (six) hours as needed for wheezing or shortness of breath. Patient not taking: Reported on 11/04/2019 07/24/17 02/09/20  Azalia Bilis, MD    Family History Family History  Problem Relation Age of Onset   Heart attack Mother    CVA Mother    Diabetes type II Mother    Hypertension Mother    Pneumonia Father     Social History Social History   Tobacco Use   Smoking status: Never   Smokeless tobacco: Never  Vaping Use   Vaping Use: Never used  Substance Use Topics   Alcohol use: Not Currently   Drug use: No     Allergies   Amoxicillin   Review of Systems Review of Systems Per HPI  Physical Exam Triage Vital Signs ED Triage Vitals  Enc Vitals Group     BP 11/07/21 0915 129/81     Pulse Rate 11/07/21 0915 85     Resp 11/07/21 0915 14     Temp 11/07/21 0915 98.1 F (36.7 C)     Temp Source 11/07/21 0915 Oral     SpO2 11/07/21 0915 100 %  Weight --      Height --      Head Circumference --      Peak Flow --      Pain Score 11/07/21 0914 0     Pain Loc --      Pain Edu? --      Excl. in GC? --    No data found.  Updated Vital Signs BP 129/81 (BP Location: Left Arm)   Pulse 85   Temp 98.1 F (36.7 C) (Oral)   Resp 14   SpO2 100%   Visual Acuity Right Eye Distance:   Left Eye Distance:   Bilateral Distance:    Right Eye Near:   Left Eye Near:    Bilateral Near:     Physical Exam Vitals and nursing note reviewed.  Constitutional:      Appearance: Normal appearance. He is not ill-appearing or toxic-appearing.     Comments: Very pleasant patient sitting on exam in position of comfort table in no acute distress.   HENT:     Head: Normocephalic and atraumatic.     Right Ear: Hearing and external ear normal.     Left Ear: Hearing and external ear normal.     Nose: Nose normal.      Mouth/Throat:     Lips: Pink.     Mouth: Mucous membranes are moist.  Eyes:     General: Lids are normal. Vision grossly intact. Gaze aligned appropriately.     Extraocular Movements: Extraocular movements intact.     Conjunctiva/sclera: Conjunctivae normal.  Pulmonary:     Effort: Pulmonary effort is normal.  Abdominal:     Palpations: Abdomen is soft.  Musculoskeletal:     Cervical back: Neck supple.  Skin:    General: Skin is warm and dry.     Capillary Refill: Capillary refill takes less than 2 seconds.     Findings: No rash.  Neurological:     General: No focal deficit present.     Mental Status: He is alert and oriented to person, place, and time. Mental status is at baseline.     Cranial Nerves: No dysarthria or facial asymmetry.     Gait: Gait is intact.  Psychiatric:        Mood and Affect: Mood normal.        Speech: Speech normal.        Behavior: Behavior normal.        Thought Content: Thought content normal.        Judgment: Judgment normal.      UC Treatments / Results  Labs (all labs ordered are listed, but only abnormal results are displayed) Labs Reviewed  POCT URINALYSIS DIPSTICK, ED / UC - Abnormal; Notable for the following components:      Result Value   Ketones, ur 15 (*)    Hgb urine dipstick TRACE (*)    All other components within normal limits  RPR  HIV ANTIBODY (ROUTINE TESTING W REFLEX)  CYTOLOGY, (ORAL, ANAL, URETHRAL) ANCILLARY ONLY    EKG   Radiology No results found.  Procedures Procedures (including critical care time)  Medications Ordered in UC Medications - No data to display  Initial Impression / Assessment and Plan / UC Course  I have reviewed the triage vital signs and the nursing notes.  Pertinent labs & imaging results that were available during my care of the patient were reviewed by me and considered in my medical decision making (see chart for details).  1.  Encounter for screening examination of sexually  transmitted illnesses STI labs pending.  Patient would like HIV and syphilis testing today.  Will notify patient of positive results and treat accordingly when labs come back.  Patient to avoid sexual intercourse until screening testing comes back.  Education provided regarding safe sexual practices and patient encouraged to use protection to prevent spread of STIs.  Considered giving IM Rocephin due to penile discharge, but patient has a penicillin allergy.  We will treat per protocol when STI results come back.  Urinalysis shows trace hematuria which is likely related to possible STI.  Encourage patient to increase his water intake to at least 64 ounces of water per day for adequate hydration as his urine shows mild dehydration as well.  Encourage patient to establish care with a primary care provider for vitamin D and iron level testing.  Patient to purchase vitamin D supplements and take 2000 IU/day until primary care appointment due to history of vitamin D deficiency in the past.   Discussed physical exam and available lab work findings in clinic with patient.  Counseled patient regarding appropriate use of medications and potential side effects for all medications recommended or prescribed today. Discussed red flag signs and symptoms of worsening condition,when to call the PCP office, return to urgent care, and when to seek higher level of care in the emergency department. Patient verbalizes understanding and agreement with plan. All questions answered. Patient discharged in stable condition.  Final Clinical Impressions(s) / UC Diagnoses   Final diagnoses:  Encounter for screening examination for sexually transmitted disease  Hematuria, unspecified type     Discharge Instructions      Avoid sexual intercourse for next few days until your STI testing comes back.  If anything on your STI results comes back positive, you will need to avoid intercourse for 1 week to allow time for treatment.    Go to [email or web address] to schedule an appointment with a primary care provider.  I have also initiated PCP assistance for you today where someone will be reaching out to you to help you find a primary care provider.  You may purchase over-the-counter vitamin D and take 2000 IU/day.   Your urinalysis shows that you are mildly dehydrated with some blood in the urine.  This may be related to a possible STI.  We will treat you for any STIs that you test positive for based on protocol when your results come back.   If you develop any new or worsening symptoms or do not improve in the next 2 to 3 days, please return.  If your symptoms are severe, please go to the emergency room.  Follow-up with your primary care provider for further evaluation and management of your symptoms as well as ongoing wellness visits.  I hope you feel better!     ED Prescriptions   None    PDMP not reviewed this encounter.   Carlisle Beers, [place] 11/07/21 1021

## 2021-11-08 ENCOUNTER — Encounter (HOSPITAL_COMMUNITY): Payer: Self-pay

## 2021-11-08 LAB — CYTOLOGY, (ORAL, ANAL, URETHRAL) ANCILLARY ONLY
Chlamydia: NEGATIVE
Comment: NEGATIVE
Comment: NEGATIVE
Comment: NORMAL
Neisseria Gonorrhea: NEGATIVE
Trichomonas: NEGATIVE

## 2021-11-08 LAB — T.PALLIDUM AB, TOTAL: T Pallidum Abs: REACTIVE — AB

## 2021-12-10 ENCOUNTER — Ambulatory Visit (HOSPITAL_COMMUNITY): Payer: Self-pay

## 2021-12-10 ENCOUNTER — Ambulatory Visit (HOSPITAL_COMMUNITY)
Admission: RE | Admit: 2021-12-10 | Discharge: 2021-12-10 | Disposition: A | Discharge status: Home / Self Care | Payer: Medicaid Other | Source: Ambulatory Visit | Attending: Internal Medicine | Admitting: Internal Medicine

## 2021-12-10 ENCOUNTER — Encounter (HOSPITAL_COMMUNITY): Payer: Self-pay

## 2021-12-10 VITALS — BP 107/67 | HR 68 | Temp 99.1°F | Resp 18

## 2021-12-10 DIAGNOSIS — M85642 Other cyst of bone, left hand: Secondary | ICD-10-CM | POA: Diagnosis not present

## 2021-12-10 MED ORDER — IBUPROFEN 600 MG PO TABS: 600.0000 mg | ORAL_TABLET | Freq: Four times a day (QID) | ORAL | 0 refills | Status: DC | PRN

## 2021-12-10 NOTE — ED Provider Notes (Signed)
[facility]    CSN: [id number] Arrival date & time: 12/10/21  1525      History   Chief Complaint Chief Complaint  Patient presents with   Hand Problem    Entered by patient    HPI Scott Sloan is a 21 y.o. male.   Patient presents to urgent care for evaluation of cyst to the dorsal aspect of the right hand that has been there for the last year.  Patient states that the cyst has become larger and more painful over the last 3 weeks.  Reports pain only when applying pressure to the right hand in the setting of pushing or pulling something heavy or performing push-ups at the gym.  Otherwise, the cyst is nontender at rest.  Cyst is movable and nonfixed.  There is no redness, swelling, or warmth to the area.  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  Surgery to remove cyst to the right hand was approximately 4 years ago when patient was in high school.  He has not attempted use of any over-the-counter medications prior to arrival urgent care for his symptoms.  No numbness or tingling to the bilateral hands.  He has full range of motion of both hands.     Past Medical History:  Diagnosis Date   Asthma     There are no problems to display for this patient.   Past Surgical History:  Procedure Laterality Date   COSMETIC SURGERY     from dog bite   EAR TUBE REMOVAL     hand tumor     TONSILLECTOMY     TYMPANOSTOMY TUBE PLACEMENT         Home Medications    Prior to Admission medications   Medication Sig Start Date End Date Taking? Authorizing Provider  ibuprofen (ADVIL) 600 MG tablet Take 1 tablet (600 mg total) by mouth every 6 (six) hours as needed. 12/10/21  Yes Carlisle Beers, FNP  albuterol (PROVENTIL HFA;VENTOLIN HFA) 108 (90 Base) MCG/ACT inhaler Inhale 1-2 puffs into the lungs every 6 (six) hours as needed for wheezing or shortness of breath. Patient not taking:  Reported on 11/04/2019 07/24/17 02/09/20  Azalia Bilis, MD    Family History Family History  Problem Relation Age of Onset   Heart attack Mother    CVA Mother    Diabetes type II Mother    Hypertension Mother    Pneumonia Father     Social History Social History   Tobacco Use   Smoking status: Never   Smokeless tobacco: Never  Vaping Use   Vaping Use: Never used  Substance Use Topics   Alcohol use: Not Currently   Drug use: No     Allergies   Amoxicillin   Review of Systems Review of Systems Per HPI  Physical Exam Triage Vital Signs ED Triage Vitals  Enc Vitals Group     BP 12/10/21 1535 107/67     Pulse Rate 12/10/21 1535 68     Resp 12/10/21 1535 18     Temp 12/10/21 1535 99.1 F (37.3 C)     Temp src --      SpO2 12/10/21 1535 98 %     Weight --      Height --      Head Circumference --      Peak Flow --      Pain  Score 12/10/21 1534 0     Pain Loc --      Pain Edu? --      Excl. in GC? --    No data found.  Updated Vital Signs BP 107/67 (BP Location: Left Arm)   Pulse 68   Temp 99.1 F (37.3 C)   Resp 18   SpO2 98%   Visual Acuity Right Eye Distance:   Left Eye Distance:   Bilateral Distance:    Right Eye Near:   Left Eye Near:    Bilateral Near:     Physical Exam Vitals and nursing note reviewed.  Constitutional:      Appearance: Normal appearance. He is not ill-appearing or toxic-appearing.     Comments: Very pleasant patient sitting on exam in position of comfort table in no acute distress.   HENT:     Head: Normocephalic and atraumatic.     Right Ear: Hearing and external ear normal.     Left Ear: Hearing and external ear normal.     Nose: Nose normal.     Mouth/Throat:     Lips: Pink.     Mouth: Mucous membranes are moist.  Eyes:     General: Lids are normal. Vision grossly intact. Gaze aligned appropriately.     Extraocular Movements: Extraocular movements intact.     Conjunctiva/sclera: Conjunctivae normal.   Pulmonary:     Effort: Pulmonary effort is normal.  Abdominal:     Palpations: Abdomen is soft.  Musculoskeletal:       Hands:     Cervical back: Neck supple.     Comments: Small cyst that is less than 0.5 cm in diameter present to palpation of the dorsal aspect of the right hand as outlined in image above.  Cyst is movable.  There is no warmth, swelling, or redness.  No obvious deformity or sign of injury.  Patient has full range of motion of all 5 digits to the right hand and the right wrist.  5/5 grip strength to the right upper extremity present.  Normal sensation.  Capillary refill is less than 3.  +2 right radial pulse present.  Skin:    General: Skin is warm and dry.     Capillary Refill: Capillary refill takes less than 2 seconds.     Findings: No rash.  Neurological:     General: No focal deficit present.     Mental Status: He is alert and oriented to person, place, and time. Mental status is at baseline.     Cranial Nerves: No dysarthria or facial asymmetry.     Gait: Gait is intact.  Psychiatric:        Mood and Affect: Mood normal.        Speech: Speech normal.        Behavior: Behavior normal.        Thought Content: Thought content normal.        Judgment: Judgment normal.      UC Treatments / Results  Labs (all labs ordered are listed, but only abnormal results are displayed) Labs Reviewed - No data to display  EKG   Radiology No results found.  Procedures Procedures (including critical care time)  Medications Ordered in UC Medications - No data to display  Initial Impression / Assessment and Plan / UC Course  I have reviewed the triage vital signs and the nursing notes.  Pertinent labs & imaging results that were available during my care of the patient were  reviewed by me and considered in my medical decision making (see chart for details).   1.  Cyst of bone of left hand No clinical indication for imaging due to stable musculoskeletal exam to the  right hand.  Advised patient to follow-up with hand specialist for further management of cyst to the dorsal aspect of the right hand.  He may use ibuprofen 600 mg every 6 hours as needed for pain.  Advised patient to return to urgent care for reevaluation if he develops any warmth, redness, swelling, or worsening pain to the right hand.  Otherwise follow-up with PCP and hand specialist.  Patient agreeable with this plan.   Discussed physical exam and available lab work findings in clinic with patient.  Counseled patient regarding appropriate use of medications and potential side effects for all medications recommended or prescribed today. Discussed red flag signs and symptoms of worsening condition,when to call the PCP office, return to urgent care, and when to seek higher level of care in the emergency department. Patient verbalizes understanding and agreement with plan. All questions answered. Patient discharged in stable condition.  Final Clinical Impressions(s) / UC Diagnoses   Final diagnoses:  Cyst of bone of left hand     Discharge Instructions      Take ibuprofen 600 mg every 6 hours as needed for pain to the cyst on your left hand. If you develop any worsening swelling, warmth, or redness to the skin near your cyst, please return to urgent care.  Otherwise, follow-up with the hand specialist by calling the phone number on your paperwork to schedule an appointment.  Return to urgent care as needed. Hope you feel better!     ED Prescriptions     Medication Sig Dispense Auth. Provider   ibuprofen (ADVIL) 600 MG tablet Take 1 tablet (600 mg total) by mouth every 6 (six) hours as needed. 30 tablet Carlisle Beers, FNP      PDMP not reviewed this encounter.   Carlisle Beers, [place] 12/10/21 1642

## 2021-12-10 NOTE — ED Triage Notes (Signed)
Patient has had a knot on top of right hand x 1 year. Three weeks ago he started having pain and cant allow pressure to the hand.

## 2021-12-10 NOTE — Discharge Instructions (Addendum)
Take ibuprofen 600 mg every 6 hours as needed for pain to the cyst on your left hand. If you develop any worsening swelling, warmth, or redness to the skin near your cyst, please return to urgent care.  Otherwise, follow-up with the hand specialist by calling the phone number on your paperwork to schedule an appointment.  Return to urgent care as needed. Hope you feel better!

## 2021-12-17 ENCOUNTER — Encounter (HOSPITAL_COMMUNITY): Payer: Self-pay

## 2021-12-17 ENCOUNTER — Ambulatory Visit (HOSPITAL_COMMUNITY)
Admission: RE | Admit: 2021-12-17 | Discharge: 2021-12-17 | Disposition: A | Discharge status: Home / Self Care | Payer: Medicaid Other | Source: Ambulatory Visit | Attending: Emergency Medicine | Admitting: Emergency Medicine

## 2021-12-17 VITALS — BP 119/72 | HR 64 | Temp 98.3°F | Resp 16

## 2021-12-17 DIAGNOSIS — Z113 Encounter for screening for infections with a predominantly sexual mode of transmission: Secondary | ICD-10-CM | POA: Insufficient documentation

## 2021-12-17 DIAGNOSIS — M85641 Other cyst of bone, right hand: Secondary | ICD-10-CM | POA: Insufficient documentation

## 2021-12-17 LAB — VITAMIN D 25 HYDROXY (VIT D DEFICIENCY, FRACTURES): Vit D, 25-Hydroxy: 17.57 ng/mL — ABNORMAL LOW (ref 30–100)

## 2021-12-17 LAB — HIV ANTIBODY (ROUTINE TESTING W REFLEX): HIV Screen 4th Generation wRfx: NONREACTIVE

## 2021-12-17 NOTE — Discharge Instructions (Addendum)
We will call you if any results return positive. Please abstain from intercourse until your results return.  I recommend following up with the orthopedic specialists regarding the cyst on your hand. [facility] does have walk-in hours.

## 2021-12-17 NOTE — ED Provider Notes (Signed)
[facility]    CSN: [id number] Arrival date & time: 12/17/21  1125     History   Chief Complaint Chief Complaint  Patient presents with   Exposure to STD    Entered by patient   Hand Problem    HPI Scott Sloan is a 21 y.o. male.  Presents for STD screening.  Denies any penile discharge, rash, testicular pain or swelling.  No dysuria.  Denies known exposures.  He would like blood work as well.  Per chart review patient has history of syphilis that was treated in October 2022.  Advised patient that his syphilis test may still be positive and we will run titers.  Additionally reports right hand issue that he was seen for 1 week ago.  Reports possible cyst on his dorsal right hand. Nontender to palpation but patient is aware of it. Patient was advised to follow-up with Ortho care but he has not seen them.  Past Medical History:  Diagnosis Date   Asthma     There are no problems to display for this patient.   Past Surgical History:  Procedure Laterality Date   COSMETIC SURGERY     from dog bite   EAR TUBE REMOVAL     hand tumor     TONSILLECTOMY     TYMPANOSTOMY TUBE PLACEMENT         Home Medications    Prior to Admission medications   Medication Sig Start Date End Date Taking? Authorizing Provider  ibuprofen (ADVIL) 600 MG tablet Take 1 tablet (600 mg total) by mouth every 6 (six) hours as needed. 12/10/21   Carlisle Beers, FNP  albuterol (PROVENTIL HFA;VENTOLIN HFA) 108 (90 Base) MCG/ACT inhaler Inhale 1-2 puffs into the lungs every 6 (six) hours as needed for wheezing or shortness of breath. Patient not taking: Reported on 11/04/2019 07/24/17 02/09/20  Azalia Bilis, MD    Family History Family History  Problem Relation Age of Onset   Heart attack Mother    CVA Mother    Diabetes type II Mother    Hypertension Mother    Pneumonia Father     Social History Social History   Tobacco Use   Smoking status: Never   Smokeless tobacco:  Never  Vaping Use   Vaping Use: Never used  Substance Use Topics   Alcohol use: Not Currently   Drug use: No     Allergies   Amoxicillin   Review of Systems Review of Systems Per HPI  Physical Exam Triage Vital Signs ED Triage Vitals  Enc Vitals Group     BP 12/17/21 1152 119/72     Pulse Rate 12/17/21 1152 64     Resp 12/17/21 1152 16     Temp 12/17/21 1152 98.3 F (36.8 C)     Temp Source 12/17/21 1152 Oral     SpO2 12/17/21 1152 96 %     Weight --      Height --      Head Circumference --      Peak Flow --      Pain Score 12/17/21 1151 0     Pain Loc --      Pain Edu? --      Excl. in GC? --    No data found.  Updated Vital Signs BP 119/72 (BP Location: Left Arm)   Pulse 64   Temp 98.3 F (36.8 C) (Oral)   Resp 16   SpO2 96%   Visual Acuity  Right Eye Distance:   Left Eye Distance:   Bilateral Distance:    Right Eye Near:   Left Eye Near:    Bilateral Near:     Physical Exam Vitals and nursing note reviewed.  Constitutional:      General: He is not in acute distress.    Appearance: Normal appearance.  HENT:     Mouth/Throat:     Pharynx: Oropharynx is clear.  Eyes:     Conjunctiva/sclera: Conjunctivae normal.  Cardiovascular:     Rate and Rhythm: Normal rate and regular rhythm.     Pulses: Normal pulses.  Pulmonary:     Effort: Pulmonary effort is normal.  Musculoskeletal:        General: No swelling, tenderness or deformity. Normal range of motion.     Cervical back: Normal range of motion.     Comments: Small, hard area on dorsum right hand. Not visible to the eye, but palpated proximal to middle finger. Non tender Full ROM of hand and fingers, grip strength 5/5.  Neurological:     Mental Status: He is alert and oriented to person, place, and time.     UC Treatments / Results  Labs (all labs ordered are listed, but only abnormal results are displayed) Labs Reviewed  RPR  HIV ANTIBODY (ROUTINE TESTING W REFLEX)  VITAMIN D 25  HYDROXY (VIT D DEFICIENCY, FRACTURES)  CYTOLOGY, (ORAL, ANAL, URETHRAL) ANCILLARY ONLY    EKG   Radiology No results found.  Procedures Procedures(including critical care time)  Medications Ordered in UC Medications - No data to display  Initial Impression / Assessment and Plan / UC Course  I have reviewed the triage vital signs and the nursing notes.  Pertinent labs & imaging results that were available during my care of the patient were reviewed by me and considered in my medical decision making (see chart for details).  Cytology swab, RPR and HIV pending.  Again advised patient that the RPR will likely still be positive, await for titer results and we will contact him with any positive result.  Vitamin D pending - patient has history of low level and would like test. He has PCP appointment in two weeks for follow up.  Provided the Ortho care information again, and [facility], and advised patient he will need to call to make an appointment. Return precautions discussed. Patient agrees to plan  Final Clinical Impressions(s) / UC Diagnoses   Final diagnoses:  Screen for STD (sexually transmitted disease)  Cyst of bone of right hand     Discharge Instructions      We will call you if any results return positive. Please abstain from intercourse until your results return.  I recommend following up with the orthopedic specialists regarding the cyst on your hand. [facility] does have walk-in hours.     ED Prescriptions   None    PDMP not reviewed this encounter.   Magaby Rumberger, [place] 12/17/21 1227

## 2021-12-17 NOTE — ED Triage Notes (Signed)
Pt presents for routine STD check with blood work. Denies any current symptoms.    Also endorses a problem with his right hand. States he was seen before for possible cyst on his right hand and was told he would be given a referral for dermatologist. States he has not heard anything else.

## 2021-12-18 LAB — CYTOLOGY, (ORAL, ANAL, URETHRAL) ANCILLARY ONLY
Chlamydia: NEGATIVE
Comment: NEGATIVE
Comment: NEGATIVE
Comment: NORMAL
Neisseria Gonorrhea: NEGATIVE
Trichomonas: NEGATIVE

## 2021-12-18 LAB — RPR: RPR Ser Ql: NONREACTIVE

## 2022-01-04 ENCOUNTER — Ambulatory Visit: Payer: Medicaid Other | Admitting: Family Medicine

## 2022-01-14 ENCOUNTER — Encounter (HOSPITAL_COMMUNITY): Payer: Self-pay | Admitting: Emergency Medicine

## 2022-01-14 ENCOUNTER — Emergency Department (HOSPITAL_COMMUNITY)
Admission: EM | Admit: 2022-01-14 | Discharge: 2022-01-15 | Disposition: A | Discharge status: Home / Self Care | Payer: Medicaid Other | Attending: Emergency Medicine | Admitting: Emergency Medicine

## 2022-01-14 DIAGNOSIS — Y92410 Unspecified street and highway as the place of occurrence of the external cause: Secondary | ICD-10-CM | POA: Diagnosis not present

## 2022-01-14 DIAGNOSIS — R519 Headache, unspecified: Secondary | ICD-10-CM | POA: Diagnosis present

## 2022-01-14 DIAGNOSIS — M25511 Pain in right shoulder: Secondary | ICD-10-CM | POA: Diagnosis not present
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
## 2022-01-15 ENCOUNTER — Emergency Department (HOSPITAL_COMMUNITY): Payer: Medicaid Other

## 2022-01-15 MED ORDER — MELOXICAM 7.5 MG PO TABS: 7.5000 mg | ORAL_TABLET | Freq: Every day | ORAL | 0 refills | Status: DC

## 2022-01-15 MED ORDER — HYDROCODONE-ACETAMINOPHEN 5-325 MG PO TABS
1.0000 | ORAL_TABLET | Freq: Once | ORAL | Status: AC
  Administered 2022-01-15: 1 via ORAL
  Filled 2022-01-15: qty 1

## 2022-01-15 MED ORDER — METHOCARBAMOL 500 MG PO TABS: 500.0000 mg | ORAL_TABLET | Freq: Two times a day (BID) | ORAL | 0 refills | Status: DC
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
## 2022-01-15 NOTE — ED Notes (Signed)
Pt calm and cooperative with nursing care. Pt Is resting in stretcher.

## 2022-01-16 ENCOUNTER — Ambulatory Visit (INDEPENDENT_AMBULATORY_CARE_PROVIDER_SITE_OTHER): Payer: Medicaid Other

## 2022-01-16 ENCOUNTER — Ambulatory Visit
Admission: EM | Admit: 2022-01-16 | Discharge: 2022-01-16 | Disposition: A | Discharge status: Home / Self Care | Payer: Medicaid Other | Attending: Urgent Care | Admitting: Urgent Care

## 2022-01-16 ENCOUNTER — Ambulatory Visit (HOSPITAL_COMMUNITY)
Admission: EM | Admit: 2022-01-16 | Discharge: 2022-01-16 | Discharge status: Absent without leave | Payer: Medicaid Other

## 2022-01-16 DIAGNOSIS — R0781 Pleurodynia: Secondary | ICD-10-CM | POA: Diagnosis not present

## 2022-01-16 DIAGNOSIS — G43809 Other migraine, not intractable, without status migrainosus: Secondary | ICD-10-CM

## 2022-01-16 DIAGNOSIS — R0789 Other chest pain: Secondary | ICD-10-CM

## 2022-01-16 DIAGNOSIS — R0782 Intercostal pain: Secondary | ICD-10-CM

## 2022-01-16 DIAGNOSIS — R208 Other disturbances of skin sensation: Secondary | ICD-10-CM | POA: Diagnosis not present

## 2022-01-16 DIAGNOSIS — Z8669 Personal history of other diseases of the nervous system and sense organs: Secondary | ICD-10-CM

## 2022-01-16 MED ORDER — KETOROLAC TROMETHAMINE 60 MG/2ML IM SOLN
60.0000 mg | Freq: Once | INTRAMUSCULAR | Status: AC
Start: 2022-01-16 — End: 2022-01-16
  Administered 2022-01-16: 60 mg via INTRAMUSCULAR

## 2022-01-16 MED ORDER — TIZANIDINE HCL 4 MG PO TABS: 4.0000 mg | ORAL_TABLET | Freq: Every day | ORAL | 0 refills | Status: DC

## 2022-01-16 MED ORDER — NAPROXEN 500 MG PO TABS: 500.0000 mg | ORAL_TABLET | Freq: Two times a day (BID) | ORAL | 0 refills | Status: DC
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
## 2022-01-16 NOTE — Discharge Instructions (Addendum)
I will either message through mychart or call to discuss x-ray results. Stop meloxicam, methocarbamol and switch to naproxen and tizanidine. Hydrate with at least 80 ounces of water daily.
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
## 2022-01-22 ENCOUNTER — Ambulatory Visit
Admission: EM | Admit: 2022-01-22 | Discharge: 2022-01-22 | Disposition: A | Discharge status: Home / Self Care | Payer: Medicaid Other | Attending: Urgent Care | Admitting: Urgent Care

## 2022-01-22 DIAGNOSIS — H9319 Tinnitus, unspecified ear: Secondary | ICD-10-CM

## 2022-01-22 DIAGNOSIS — R519 Headache, unspecified: Secondary | ICD-10-CM

## 2022-01-22 DIAGNOSIS — R42 Dizziness and giddiness: Secondary | ICD-10-CM

## 2022-01-22 MED ORDER — LEVOCETIRIZINE DIHYDROCHLORIDE 5 MG PO TABS: 5.0000 mg | ORAL_TABLET | Freq: Every evening | ORAL | 0 refills | Status: DC

## 2022-01-22 MED ORDER — EXCEDRIN MIGRAINE 250-250-65 MG PO TABS: 1.0000 | ORAL_TABLET | Freq: Four times a day (QID) | ORAL | 0 refills | Status: DC | PRN

## 2022-01-22 MED ORDER — FLUTICASONE PROPIONATE 50 MCG/ACT NA SUSP: 2.0000 | Freq: Every day | NASAL | 12 refills | Status: DC

## 2022-01-22 MED ORDER — PSEUDOEPHEDRINE HCL 30 MG PO TABS: 30.0000 mg | ORAL_TABLET | Freq: Three times a day (TID) | ORAL | 0 refills | Status: DC | PRN
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
## 2022-08-19 ENCOUNTER — Ambulatory Visit (HOSPITAL_COMMUNITY): Payer: Self-pay

## 2022-10-09 ENCOUNTER — Ambulatory Visit
Admission: RE | Admit: 2022-10-09 | Discharge: 2022-10-09 | Disposition: A | Discharge status: Home / Self Care | Payer: Medicaid Other | Source: Ambulatory Visit | Attending: Emergency Medicine | Admitting: Emergency Medicine

## 2022-10-09 VITALS — BP 127/77 | HR 87 | Temp 98.5°F | Resp 18

## 2022-10-09 DIAGNOSIS — N4889 Other specified disorders of penis: Secondary | ICD-10-CM | POA: Diagnosis present

## 2022-10-09 DIAGNOSIS — Z113 Encounter for screening for infections with a predominantly sexual mode of transmission: Secondary | ICD-10-CM | POA: Diagnosis present

## 2022-10-09 DIAGNOSIS — H66002 Acute suppurative otitis media without spontaneous rupture of ear drum, left ear: Secondary | ICD-10-CM

## 2022-10-09 MED ORDER — CEFDINIR 300 MG PO CAPS: 300.0000 mg | ORAL_CAPSULE | Freq: Two times a day (BID) | ORAL | 0 refills | Status: AC
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
## 2022-10-10 LAB — CYTOLOGY, (ORAL, ANAL, URETHRAL) ANCILLARY ONLY
Chlamydia: NEGATIVE
Comment: NEGATIVE
Comment: NEGATIVE
Comment: NORMAL
Neisseria Gonorrhea: NEGATIVE
Trichomonas: NEGATIVE

## 2022-10-10 LAB — RPR: RPR Ser Ql: NONREACTIVE

## 2022-10-10 LAB — HIV ANTIBODY (ROUTINE TESTING W REFLEX): HIV Screen 4th Generation wRfx: NONREACTIVE
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
## 2023-01-17 ENCOUNTER — Ambulatory Visit
Admission: EM | Admit: 2023-01-17 | Discharge: 2023-01-17 | Disposition: A | Discharge status: Home / Self Care | Payer: Medicaid Other | Attending: Internal Medicine | Admitting: Internal Medicine

## 2023-01-17 DIAGNOSIS — Z113 Encounter for screening for infections with a predominantly sexual mode of transmission: Secondary | ICD-10-CM | POA: Insufficient documentation

## 2023-01-17 NOTE — ED Provider Notes (Signed)
[facility]    CSN: [id number] Arrival date & time: 01/17/23  1354      History   Chief Complaint Chief Complaint  Patient presents with   Exposure to STD    HPI Scott Sloan is a 22 y.o. male presents for STD screening.  Patient currently denies any symptoms including penile discharge, dysuria, testicular pain or swelling.  No known STD exposure.  Nurse note on intake reports lower abdominal pain the patient states is related to a stomach bug that he had a couple weeks ago and reports the symptoms have completely resolved.  Currently denies any abdominal pain, fevers.  No other concerns at this time.   Exposure to STD    Past Medical History:  Diagnosis Date   Asthma     There are no problems to display for this patient.   Past Surgical History:  Procedure Laterality Date   COSMETIC SURGERY     from dog bite   EAR TUBE REMOVAL     hand tumor     TONSILLECTOMY     TYMPANOSTOMY TUBE PLACEMENT         Home Medications    Prior to Admission medications   Medication Sig Start Date End Date Taking? Authorizing Provider  aspirin-acetaminophen-caffeine (EXCEDRIN MIGRAINE) [phone] MG tablet Take 1 tablet by mouth every 6 (six) hours as needed for headache. 01/22/22   Wallis Bamberg, PA-C  fluticasone [facility]) 50 MCG/ACT nasal spray Place 2 sprays into both nostrils daily. 01/22/22   Wallis Bamberg, PA-C  levocetirizine (XYZAL) 5 MG tablet Take 1 tablet (5 mg total) by mouth every evening. 01/22/22   Wallis Bamberg, PA-C  naproxen (NAPROSYN) 500 MG tablet Take 1 tablet (500 mg total) by mouth 2 (two) times daily with a meal. 01/16/22   Wallis Bamberg, PA-C  pseudoephedrine (SUDAFED) 30 MG tablet Take 1 tablet (30 mg total) by mouth every 8 (eight) hours as needed for congestion. 01/22/22   Wallis Bamberg, PA-C  tiZANidine (ZANAFLEX) 4 MG tablet Take 1 tablet (4 mg total) by mouth at bedtime. 01/16/22   Wallis Bamberg, PA-C  albuterol (PROVENTIL HFA;VENTOLIN HFA) 108 (90 Base)  MCG/ACT inhaler Inhale 1-2 puffs into the lungs every 6 (six) hours as needed for wheezing or shortness of breath. Patient not taking: Reported on 11/04/2019 07/24/17 02/09/20  Azalia Bilis, MD    Family History Family History  Problem Relation Age of Onset   Heart attack Mother    CVA Mother    Diabetes type II Mother    Hypertension Mother    Pneumonia Father     Social History Social History   Tobacco Use   Smoking status: Never   Smokeless tobacco: Never  Vaping Use   Vaping status: Never Used  Substance Use Topics   Alcohol use: Not Currently   Drug use: No     Allergies   Amoxicillin   Review of Systems Review of Systems  Genitourinary:        STD screening     Physical Exam Triage Vital Signs ED Triage Vitals  Encounter Vitals Group     BP 01/17/23 1426 126/72     Systolic BP Percentile --      Diastolic BP Percentile --      Pulse Rate 01/17/23 1426 85     Resp 01/17/23 1426 16     Temp 01/17/23 1426 99.3 F (37.4 C)     Temp Source 01/17/23 1426 Oral  SpO2 01/17/23 1426 97 %     Weight --      Height --      Head Circumference --      Peak Flow --      Pain Score 01/17/23 1425 0     Pain Loc --      Pain Education --      Exclude from Growth Chart --    No data found.  Updated Vital Signs BP 126/72 (BP Location: Right Arm)   Pulse 85   Temp 99.3 F (37.4 C) (Oral)   Resp 16   SpO2 97%   Visual Acuity Right Eye Distance:   Left Eye Distance:   Bilateral Distance:    Right Eye Near:   Left Eye Near:    Bilateral Near:     Physical Exam Vitals and nursing note reviewed.  Constitutional:      General: He is not in acute distress.    Appearance: Normal appearance. He is not ill-appearing.  HENT:     Head: Normocephalic and atraumatic.  Eyes:     Pupils: Pupils are equal, round, and reactive to light.  Cardiovascular:     Rate and Rhythm: Normal rate.  Pulmonary:     Effort: Pulmonary effort is normal.  Skin:     General: Skin is warm and dry.  Neurological:     General: No focal deficit present.     Mental Status: He is alert and oriented to person, place, and time.  Psychiatric:        Mood and Affect: Mood normal.        Behavior: Behavior normal.      UC Treatments / Results  Labs (all labs ordered are listed, but only abnormal results are displayed) Labs Reviewed  HIV ANTIBODY (ROUTINE TESTING W REFLEX)  RPR  CYTOLOGY, (ORAL, ANAL, URETHRAL) ANCILLARY ONLY    EKG   Radiology No results found.  Procedures Procedures (including critical care time)  Medications Ordered in UC Medications - No data to display  Initial Impression / Assessment and Plan / UC Course  I have reviewed the triage vital signs and the nursing notes.  Pertinent labs & imaging results that were available during my care of the patient were reviewed by me and considered in my medical decision making (see chart for details).     STD testing is ordered and will contact for any positive results.  Follow-up as needed.  ER precautions reviewed. Final Clinical Impressions(s) / UC Diagnoses   Final diagnoses:  Screening examination for STD (sexually transmitted disease)     Discharge Instructions      The clinic will contact you if the results of the testing done today is positive. Follow up with your PCP as needed    ED Prescriptions   None    PDMP not reviewed this encounter.   Radford Pax, NP 01/17/23 [phone]

## 2023-01-17 NOTE — ED Triage Notes (Signed)
Pt requested STD's test.   Pt reports low abdominal [pressure x 1 week. Reports he had stomach bug couple weeks ago.

## 2023-01-17 NOTE — Discharge Instructions (Addendum)
The clinic will contact you if the results of the testing done today is positive. Follow up with your PCP as needed

## 2023-01-18 LAB — HIV ANTIBODY (ROUTINE TESTING W REFLEX): HIV Screen 4th Generation wRfx: NONREACTIVE

## 2023-01-18 LAB — RPR: RPR Ser Ql: NONREACTIVE

## 2023-01-20 LAB — CYTOLOGY, (ORAL, ANAL, URETHRAL) ANCILLARY ONLY
Chlamydia: POSITIVE — AB
Comment: NEGATIVE
Comment: NEGATIVE
Comment: NORMAL
Neisseria Gonorrhea: NEGATIVE
Trichomonas: NEGATIVE

## 2023-01-21 ENCOUNTER — Telehealth: Payer: Self-pay

## 2023-01-21 MED ORDER — DOXYCYCLINE HYCLATE 100 MG PO CAPS: 100.0000 mg | ORAL_CAPSULE | Freq: Two times a day (BID) | ORAL | 0 refills | Status: DC
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
## 2023-02-13 ENCOUNTER — Ambulatory Visit
Admission: EM | Admit: 2023-02-13 | Discharge: 2023-02-13 | Disposition: A | Discharge status: Home / Self Care | Payer: Medicaid Other | Attending: Internal Medicine | Admitting: Internal Medicine

## 2023-02-13 DIAGNOSIS — R112 Nausea with vomiting, unspecified: Secondary | ICD-10-CM | POA: Diagnosis not present

## 2023-02-13 DIAGNOSIS — R197 Diarrhea, unspecified: Secondary | ICD-10-CM | POA: Diagnosis not present

## 2023-02-13 DIAGNOSIS — A084 Viral intestinal infection, unspecified: Secondary | ICD-10-CM | POA: Diagnosis not present

## 2023-02-13 MED ORDER — LOPERAMIDE HCL 2 MG PO CAPS: 2.0000 mg | ORAL_CAPSULE | Freq: Two times a day (BID) | ORAL | 0 refills | Status: DC | PRN

## 2023-02-13 MED ORDER — ONDANSETRON 8 MG PO TBDP: 8.0000 mg | ORAL_TABLET | Freq: Three times a day (TID) | ORAL | 0 refills | Status: DC | PRN

## 2023-02-13 NOTE — ED Provider Notes (Signed)
[facility]  Note:  This document was prepared using Conservation officer, historic buildings and may include unintentional dictation errors.  MRN: [id number] DOB: Sep 05, 2000  Subjective:   Scott Sloan is a 22 y.o. male presenting for 2-day history of persistent nausea, vomiting, diarrhea.  Has felt fatigued for the past week.  No cough, chest pain, shortness of breath or wheezing.  Patient asked about when he should get tested again for clearance of sexually-transmitted infection.  Is been 2 weeks since he finished doxycycline.  Denies dysuria, hematuria, urinary frequency, penile discharge, penile swelling, testicular pain, testicular swelling, anal pain, groin pain. No fever, bloody stools, hematemesis, hospitalizations or long distance travel.  Has not eaten raw foods, drank unfiltered water.  No history of GI disorders including Crohn's, IBS, ulcerative colitis.   No current facility-administered medications for this encounter.  Current Outpatient Medications:    aspirin-acetaminophen-caffeine (EXCEDRIN MIGRAINE) 250-250-65 MG tablet, Take 1 tablet by mouth every 6 (six) hours as needed for headache., Disp: 30 tablet, Rfl: 0   doxycycline (VIBRAMYCIN) 100 MG capsule, Take 1 capsule (100 mg total) by mouth 2 (two) times daily., Disp: 14 capsule, Rfl: 0   fluticasone (FLONASE) 50 MCG/ACT nasal spray, Place 2 sprays into both nostrils daily., Disp: 16 g, Rfl: 12   levocetirizine (XYZAL) 5 MG tablet, Take 1 tablet (5 mg total) by mouth every evening., Disp: 30 tablet, Rfl: 0   naproxen (NAPROSYN) 500 MG tablet, Take 1 tablet (500 mg total) by mouth 2 (two) times daily with a meal., Disp: 30 tablet, Rfl: 0   pseudoephedrine (SUDAFED) 30 MG tablet, Take 1 tablet (30 mg total) by mouth every 8 (eight) hours as needed for congestion., Disp: 30 tablet, Rfl: 0   tiZANidine (ZANAFLEX) 4 MG tablet, Take 1 tablet (4 mg total) by mouth at bedtime., Disp: 30 tablet, Rfl: 0    Allergies  Allergen Reactions   Amoxicillin Hives    Has patient had a PCN reaction causing immediate rash, facial/tongue/throat swelling, SOB or lightheadedness with hypotension:Unknown Has patient had a PCN reaction causing severe rash involving mucus membranes or skin necrosis: Unknown Has patient had a PCN reaction that required hospitalization:Unknown Has patient had a PCN reaction occurring within the last 10 years: Unknown If all of the above answers are "NO", then may proceed with Cephalosporin use.     Past Medical History:  Diagnosis Date   Asthma      Past Surgical History:  Procedure Laterality Date   COSMETIC SURGERY     from dog bite   EAR TUBE REMOVAL     hand tumor     TONSILLECTOMY     TYMPANOSTOMY TUBE PLACEMENT      Family History  Problem Relation Age of Onset   Heart attack Mother    CVA Mother    Diabetes type II Mother    Hypertension Mother    Pneumonia Father     Social History   Tobacco Use   Smoking status: Never   Smokeless tobacco: Never  Vaping Use   Vaping status: Never Used  Substance Use Topics   Alcohol use: Not Currently   Drug use: No    ROS   Objective:   Vitals: BP 123/77 (BP Location: Right Arm)   Pulse 82   Temp 99.5 F (37.5 C) (Oral)   Resp 14   SpO2 97%   Physical Exam Constitutional:      General: He is not in acute  distress.    Appearance: Normal appearance. He is well-developed and normal weight. He is not ill-appearing, toxic-appearing or diaphoretic.  HENT:     Head: Normocephalic and atraumatic.     Right Ear: External ear normal.     Left Ear: External ear normal.     Nose: Nose normal.     Mouth/Throat:     Pharynx: Oropharynx is clear.  Eyes:     General: No scleral icterus.       Right eye: No discharge.        Left eye: No discharge.     Extraocular Movements: Extraocular movements intact.  Cardiovascular:     Rate and Rhythm: Normal rate.  Pulmonary:     Effort: Pulmonary effort  is normal.  Abdominal:     General: Bowel sounds are increased. There is no distension.     Palpations: Abdomen is soft. There is no mass.     Tenderness: There is no abdominal tenderness. There is no right CVA tenderness, left CVA tenderness, guarding or rebound.  Musculoskeletal:     Cervical back: Normal range of motion.  Neurological:     Mental Status: He is alert and oriented to person, place, and time.  Psychiatric:        Mood and Affect: Mood normal.        Behavior: Behavior normal.        Thought Content: Thought content normal.        Judgment: Judgment normal.     Assessment and Plan :   PDMP not reviewed this encounter.  1. Viral gastroenteritis   2. Nausea vomiting and diarrhea    Will manage for suspected viral gastroenteritis with supportive care.  Recommended patient hydrate well, eat light meals and maintain electrolytes.  Will use Zofran and Imodium for nausea, vomiting and diarrhea. Counseled patient on potential for adverse effects with medications prescribed/recommended today, ER and return-to-clinic precautions discussed, patient verbalized understanding.    Wallis Bamberg, PA-C 02/13/23 1902

## 2023-02-13 NOTE — Discharge Instructions (Addendum)
Make sure you push fluids drinking mostly water but mix it with Gatorade.  Try to eat light meals including soups, broths and soft foods, fruits.  You may use Zofran for your nausea and vomiting once every 8 hours.  Imodium can help with diarrhea but use this carefully limiting it to 1-2 times per day only if you are having a lot of diarrhea.  Please return to the clinic if symptoms worsen or you start having severe abdominal pain not helped by taking Tylenol or start having bloody stools or blood in the vomit.  The recommended time to retest for clearance of a sexually transmitted infection is 5-6 weeks. So based off of your timeline, the most reliable testing would Nov 1st.

## 2023-02-13 NOTE — ED Triage Notes (Signed)
Pt reports fatigue x 1 week; nausea x 1 day; vomited 2 times today. Pt has not taken any meds for complaints.

## 2023-02-17 NOTE — Plan of Care (Signed)
[facility] Tonsillectomy/Adenoidectomy, Postoperative PEDS care plan entered in error.

## 2023-02-23 ENCOUNTER — Encounter (HOSPITAL_COMMUNITY): Payer: Self-pay | Admitting: *Deleted

## 2023-02-23 ENCOUNTER — Other Ambulatory Visit: Payer: Self-pay

## 2023-02-23 ENCOUNTER — Ambulatory Visit (HOSPITAL_COMMUNITY)
Admission: EM | Admit: 2023-02-23 | Discharge: 2023-02-23 | Disposition: A | Discharge status: Home / Self Care | Payer: Medicaid Other | Attending: Family Medicine | Admitting: Family Medicine

## 2023-02-23 ENCOUNTER — Ambulatory Visit (INDEPENDENT_AMBULATORY_CARE_PROVIDER_SITE_OTHER): Payer: Medicaid Other

## 2023-02-23 DIAGNOSIS — R109 Unspecified abdominal pain: Secondary | ICD-10-CM

## 2023-02-23 NOTE — ED Triage Notes (Signed)
C/O intermittent lower abd discomfort over past 10 days. States initially it felt "like food was just making my stomach feel weird". States he isn't having significant pain, but c/o sensation of "a pressure and growling". Also c/o increased fatigue recently. Pt wishes to note that he Positive chlamydia and gonorrhea -- completed meds 9/25, and isn't sure if the meds are R/T his stomach issues now.

## 2023-02-23 NOTE — ED Provider Notes (Signed)
[facility]    CSN: [id number] Arrival date & time: 02/23/23  1054      History   Chief Complaint Chief Complaint  Patient presents with   Abdominal Pain    HPI Scott Sloan is a 22 y.o. male.    Abdominal Pain Associated symptoms: nausea    Patient is here for abd pain.  He did test positive for STDs last month, completed all meds 9/25.  Not sure if related.  About the 2nd week of October he started with upset stomach after eating.  Over the last 2 weeks he has had more consistent issues.  Pain that comes and go, but the discomfort is consistent.  Pain is all over, mostly across the lower abdomen.  Gets a "rumbling" in his stomach that he has not had before.  He will get a rare feeling of nausea. He did vomit several times last week when this started.  Maybe constipated? He is trying to eat softer foods on his stomach.  He has been more fatigued/sleepy lately.  Less energetic.  Appetite as been inconsistent.  No otc meds taken.        Past Medical History:  Diagnosis Date   Asthma     There are no problems to display for this patient.   Past Surgical History:  Procedure Laterality Date   COSMETIC SURGERY     from dog bite   EAR TUBE REMOVAL     hand tumor     TONSILLECTOMY     TYMPANOSTOMY TUBE PLACEMENT         Home Medications    Prior to Admission medications   Medication Sig Start Date End Date Taking? Authorizing Provider  aspirin-acetaminophen-caffeine (EXCEDRIN MIGRAINE) [phone] MG tablet Take 1 tablet by mouth every 6 (six) hours as needed for headache. 01/22/22   Wallis Bamberg, PA-C  doxycycline (VIBRAMYCIN) 100 MG capsule Take 1 capsule (100 mg total) by mouth 2 (two) times daily. 01/21/23   Merrilee Jansky, MD  fluticasone (FLONASE) 50 MCG/ACT nasal spray Place 2 sprays into both nostrils daily. 01/22/22   Wallis Bamberg, PA-C  levocetirizine (XYZAL) 5 MG tablet Take 1 tablet (5 mg total) by mouth every evening. 01/22/22    Wallis Bamberg, PA-C  loperamide (IMODIUM) 2 MG capsule Take 1 capsule (2 mg total) by mouth 2 (two) times daily as needed for diarrhea or loose stools. 02/13/23   Wallis Bamberg, PA-C  naproxen (NAPROSYN) 500 MG tablet Take 1 tablet (500 mg total) by mouth 2 (two) times daily with a meal. 01/16/22   Wallis Bamberg, PA-C  ondansetron (ZOFRAN-ODT) 8 MG disintegrating tablet Take 1 tablet (8 mg total) by mouth every 8 (eight) hours as needed for nausea or vomiting. 02/13/23   Wallis Bamberg, PA-C  pseudoephedrine (SUDAFED) 30 MG tablet Take 1 tablet (30 mg total) by mouth every 8 (eight) hours as needed for congestion. 01/22/22   Wallis Bamberg, PA-C  tiZANidine (ZANAFLEX) 4 MG tablet Take 1 tablet (4 mg total) by mouth at bedtime. 01/16/22   Wallis Bamberg, PA-C  albuterol (PROVENTIL HFA;VENTOLIN HFA) 108 (90 Base) MCG/ACT inhaler Inhale 1-2 puffs into the lungs every 6 (six) hours as needed for wheezing or shortness of breath. Patient not taking: Reported on 11/04/2019 07/24/17 02/09/20  Azalia Bilis, MD    Family History Family History  Problem Relation Age of Onset   Heart attack Mother    CVA Mother    Diabetes type II Mother  Hypertension Mother    Pneumonia Father     Social History Social History   Tobacco Use   Smoking status: Some Days    Types: Cigars   Smokeless tobacco: Never  Vaping Use   Vaping status: Never Used  Substance Use Topics   Alcohol use: Yes    Comment: occasionally   Drug use: Yes    Types: Marijuana    Comment: occasional use     Allergies   Amoxicillin   Review of Systems Review of Systems  Constitutional: Negative.   HENT: Negative.    Respiratory: Negative.    Cardiovascular: Negative.   Gastrointestinal:  Positive for abdominal pain and nausea.  Musculoskeletal: Negative.   Skin: Negative.   Psychiatric/Behavioral: Negative.       Physical Exam Triage Vital Signs ED Triage Vitals  Encounter Vitals Group     BP 02/23/23 1122 121/78     Systolic BP  Percentile --      Diastolic BP Percentile --      Pulse Rate 02/23/23 1122 73     Resp 02/23/23 1122 16     Temp 02/23/23 1122 98.1 F (36.7 C)     Temp Source 02/23/23 1122 Oral     SpO2 02/23/23 1122 98 %     Weight --      Height --      Head Circumference --      Peak Flow --      Pain Score 02/23/23 1123 3     Pain Loc --      Pain Education --      Exclude from Growth Chart --    No data found.  Updated Vital Signs BP 121/78   Pulse 73   Temp 98.1 F (36.7 C) (Oral)   Resp 16   SpO2 98%   Visual Acuity Right Eye Distance:   Left Eye Distance:   Bilateral Distance:    Right Eye Near:   Left Eye Near:    Bilateral Near:     Physical Exam Constitutional:      General: He is not in acute distress.    Appearance: He is well-developed. He is not ill-appearing.  Cardiovascular:     Rate and Rhythm: Normal rate and regular rhythm.  Pulmonary:     Effort: Pulmonary effort is normal.     Breath sounds: Normal breath sounds.  Abdominal:     General: Bowel sounds are normal.     Palpations: Abdomen is soft.     Tenderness: There is abdominal tenderness. There is no guarding or rebound.     Comments: Patient is mildly tender at the left lower abdomen and left of the umbilicus  Skin:    General: Skin is warm.  Neurological:     General: No focal deficit present.     Mental Status: He is alert.      UC Treatments / Results  Labs (all labs ordered are listed, but only abnormal results are displayed) Labs Reviewed - No data to display  EKG   Radiology DG Abd 1 View  Result Date: 02/23/2023 CLINICAL DATA:  Intermittent lower abdominal pain and discomfort. EXAM: ABDOMEN - 1 VIEW COMPARISON:  None Available. FINDINGS: The bowel gas pattern is nonobstructed. There are no dilated loops of large or small bowel. Moderate retained stool identified within the colon and rectum. No signs of pneumoperitoneum. IMPRESSION: 1. Nonobstructive bowel gas pattern. 2.  Moderate retained stool within the colon and rectum. Electronically  Signed   By: Signa Kell M.D.   On: 02/23/2023 12:39    Procedures Procedures (including critical care time)  Medications Ordered in UC Medications - No data to display  Initial Impression / Assessment and Plan / UC Course  I have reviewed the triage vital signs and the nursing notes.  Pertinent labs & imaging results that were available during my care of the patient were reviewed by me and considered in my medical decision making (see chart for details).   Final Clinical Impressions(s) / UC Diagnoses   Final diagnoses:  Abdominal pain, unspecified abdominal location     Discharge Instructions      You were seen today for abdominal pain.  Your xray does show moderate stool in the colon on the left.  I recommend you start over the counter mirilax daily to help clean out your bowels.  If this is not helpful and you continue with symptoms then please follow up with your primary care provider.  If you develop severe pain, nausea, vomiting, fever/chills then please go to the ER for further evaluation.    ED Prescriptions   None    PDMP not reviewed this encounter.   Jannifer Franklin, MD 02/23/23 1244

## 2023-02-23 NOTE — Discharge Instructions (Addendum)
You were seen today for abdominal pain.  Your xray does show moderate stool in the colon on the left.  I recommend you start over the counter mirilax daily to help clean out your bowels.  If this is not helpful and you continue with symptoms then please follow up with your primary care provider.  If you develop severe pain, nausea, vomiting, fever/chills then please go to the ER for further evaluation.

## 2023-03-05 ENCOUNTER — Ambulatory Visit
Admission: EM | Admit: 2023-03-05 | Discharge: 2023-03-05 | Disposition: A | Discharge status: Home / Self Care | Payer: Medicaid Other | Attending: Internal Medicine | Admitting: Internal Medicine

## 2023-03-05 DIAGNOSIS — Z113 Encounter for screening for infections with a predominantly sexual mode of transmission: Secondary | ICD-10-CM | POA: Insufficient documentation

## 2023-03-05 NOTE — ED Triage Notes (Signed)
Pt presents for STD testing. Pt denies sxs. Pt is requesting blood work.

## 2023-03-05 NOTE — ED Provider Notes (Signed)
[facility]    CSN: [id number] Arrival date & time: 03/05/23  9562      History   Chief Complaint Chief Complaint  Patient presents with   SEXUALLY TRANSMITTED DISEASE    HPI Scott Sloan is a 22 y.o. male presents for STD screening.  Patient states he was seen by [facility] at the beginning of September where he tested positive for gonorrhea and chlamydia of the anus.  States he was treated and was asymptomatic at that time.  He was seen in urgent care on 9/20 where he had a urethral swab done that was positive for chlamydia.  He states he was treated with doxycycline.  Again he was asymptomatic at that time.  Returns today for retesting/test of cure.  He is requesting both urethral and anal swabs.  He denies any current symptoms including penile discharge, testicular pain or swelling, fevers, dysuria.  No other concerns at this time.  HPI  Past Medical History:  Diagnosis Date   Asthma     There are no problems to display for this patient.   Past Surgical History:  Procedure Laterality Date   COSMETIC SURGERY     from dog bite   EAR TUBE REMOVAL     hand tumor     TONSILLECTOMY     TYMPANOSTOMY TUBE PLACEMENT         Home Medications    Prior to Admission medications   Medication Sig Start Date End Date Taking? Authorizing Provider  aspirin-acetaminophen-caffeine (EXCEDRIN MIGRAINE) [phone] MG tablet Take 1 tablet by mouth every 6 (six) hours as needed for headache. 01/22/22   Wallis Bamberg, PA-C  doxycycline (VIBRAMYCIN) 100 MG capsule Take 1 capsule (100 mg total) by mouth 2 (two) times daily. 01/21/23   Merrilee Jansky, MD  fluticasone (FLONASE) 50 MCG/ACT nasal spray Place 2 sprays into both nostrils daily. 01/22/22   Wallis Bamberg, PA-C  levocetirizine (XYZAL) 5 MG tablet Take 1 tablet (5 mg total) by mouth every evening. 01/22/22   Wallis Bamberg, PA-C  loperamide (IMODIUM) 2 MG capsule Take 1 capsule (2 mg total) by mouth 2 (two) times daily  as needed for diarrhea or loose stools. 02/13/23   Wallis Bamberg, PA-C  naproxen (NAPROSYN) 500 MG tablet Take 1 tablet (500 mg total) by mouth 2 (two) times daily with a meal. 01/16/22   Wallis Bamberg, PA-C  ondansetron (ZOFRAN-ODT) 8 MG disintegrating tablet Take 1 tablet (8 mg total) by mouth every 8 (eight) hours as needed for nausea or vomiting. 02/13/23   Wallis Bamberg, PA-C  pseudoephedrine (SUDAFED) 30 MG tablet Take 1 tablet (30 mg total) by mouth every 8 (eight) hours as needed for congestion. 01/22/22   Wallis Bamberg, PA-C  tiZANidine (ZANAFLEX) 4 MG tablet Take 1 tablet (4 mg total) by mouth at bedtime. 01/16/22   Wallis Bamberg, PA-C  albuterol (PROVENTIL HFA;VENTOLIN HFA) 108 (90 Base) MCG/ACT inhaler Inhale 1-2 puffs into the lungs every 6 (six) hours as needed for wheezing or shortness of breath. Patient not taking: Reported on 11/04/2019 07/24/17 02/09/20  Azalia Bilis, MD    Family History Family History  Problem Relation Age of Onset   Heart attack Mother    CVA Mother    Diabetes type II Mother    Hypertension Mother    Pneumonia Father     Social History Social History   Tobacco Use   Smoking status: Some Days    Types: Cigars   Smokeless tobacco: Never  Vaping Use   Vaping status: Never Used  Substance Use Topics   Alcohol use: Yes    Comment: occasionally   Drug use: Yes    Types: Marijuana    Comment: occasional use     Allergies   Amoxicillin   Review of Systems Review of Systems  Genitourinary:        Std screening     Physical Exam Triage Vital Signs ED Triage Vitals  Encounter Vitals Group     BP 03/05/23 1022 128/74     Systolic BP Percentile --      Diastolic BP Percentile --      Pulse Rate 03/05/23 1022 67     Resp 03/05/23 1022 17     Temp 03/05/23 1022 98.5 F (36.9 C)     Temp Source 03/05/23 1022 Oral     SpO2 03/05/23 1022 98 %     Weight --      Height --      Head Circumference --      Peak Flow --      Pain Score 03/05/23 1021 0      Pain Loc --      Pain Education --      Exclude from Growth Chart --    No data found.  Updated Vital Signs BP 128/74 (BP Location: Left Arm)   Pulse 67   Temp 98.5 F (36.9 C) (Oral)   Resp 17   SpO2 98%   Visual Acuity Right Eye Distance:   Left Eye Distance:   Bilateral Distance:    Right Eye Near:   Left Eye Near:    Bilateral Near:     Physical Exam Vitals and nursing note reviewed.  Constitutional:      General: He is not in acute distress.    Appearance: Normal appearance. He is not ill-appearing.  HENT:     Head: Normocephalic and atraumatic.  Eyes:     Pupils: Pupils are equal, round, and reactive to light.  Cardiovascular:     Rate and Rhythm: Normal rate.  Pulmonary:     Effort: Pulmonary effort is normal.  Skin:    General: Skin is warm and dry.  Neurological:     General: No focal deficit present.     Mental Status: He is alert and oriented to person, place, and time.  Psychiatric:        Mood and Affect: Mood normal.        Behavior: Behavior normal.      UC Treatments / Results  Labs (all labs ordered are listed, but only abnormal results are displayed) Labs Reviewed  RPR  HIV ANTIBODY (ROUTINE TESTING W REFLEX)  CYTOLOGY, (ORAL, ANAL, URETHRAL) ANCILLARY ONLY  CYTOLOGY, (ORAL, ANAL, URETHRAL) ANCILLARY ONLY    EKG   Radiology No results found.  Procedures Procedures (including critical care time)  Medications Ordered in UC Medications - No data to display  Initial Impression / Assessment and Plan / UC Course  I have reviewed the triage vital signs and the nursing notes.  Pertinent labs & imaging results that were available during my care of the patient were reviewed by me and considered in my medical decision making (see chart for details).      Discussed exam and symptoms with patient.  No red flags.  STD testing as ordered and will contact for any positive results.  Patient to follow-up as needed. Final Clinical  Impressions(s) / UC Diagnoses   Final  diagnoses:  Screening examination for STD (sexually transmitted disease)     Discharge Instructions      The clinic will contact you with results of the testing done today if positive.  Please follow-up with your PCP as needed.    ED Prescriptions   None    PDMP not reviewed this encounter.   Radford Pax, NP 03/05/23 1048

## 2023-03-05 NOTE — Discharge Instructions (Addendum)
The clinic will contact you with results of the testing done today if positive Please follow-up with your PCP as needed 

## 2023-03-06 LAB — CYTOLOGY, (ORAL, ANAL, URETHRAL) ANCILLARY ONLY
Chlamydia: NEGATIVE
Chlamydia: NEGATIVE
Comment: NEGATIVE
Comment: NEGATIVE
Comment: NORMAL
Comment: NEGATIVE
Comment: NEGATIVE
Comment: NORMAL
Neisseria Gonorrhea: NEGATIVE
Neisseria Gonorrhea: NEGATIVE
Trichomonas: NEGATIVE
Trichomonas: NEGATIVE

## 2023-03-06 LAB — HIV ANTIBODY (ROUTINE TESTING W REFLEX): HIV Screen 4th Generation wRfx: NONREACTIVE

## 2023-03-06 LAB — RPR: RPR Ser Ql: NONREACTIVE

## 2023-03-19 ENCOUNTER — Ambulatory Visit: Payer: Self-pay

## 2023-04-29 ENCOUNTER — Ambulatory Visit
Admission: EM | Admit: 2023-04-29 | Discharge: 2023-04-29 | Disposition: A | Discharge status: Home / Self Care | Payer: Medicaid Other | Attending: Family Medicine | Admitting: Family Medicine

## 2023-04-29 DIAGNOSIS — J069 Acute upper respiratory infection, unspecified: Secondary | ICD-10-CM

## 2023-04-29 DIAGNOSIS — Z8709 Personal history of other diseases of the respiratory system: Secondary | ICD-10-CM

## 2023-04-29 LAB — POC COVID19/FLU A&B COMBO
Covid Antigen, POC: NEGATIVE
Influenza A Antigen, POC: NEGATIVE
Influenza B Antigen, POC: NEGATIVE

## 2023-04-29 MED ORDER — PREDNISONE 20 MG PO TABS: ORAL_TABLET | ORAL | 0 refills | Status: AC

## 2023-04-29 MED ORDER — CETIRIZINE HCL 10 MG PO TABS: 10.0000 mg | ORAL_TABLET | Freq: Every day | ORAL | 0 refills | Status: AC

## 2023-04-29 MED ORDER — PROMETHAZINE-DM 6.25-15 MG/5ML PO SYRP: 5.0000 mL | ORAL_SOLUTION | Freq: Three times a day (TID) | ORAL | 0 refills | Status: AC | PRN

## 2023-04-29 NOTE — ED Provider Notes (Addendum)
 [facility]  Note:  This document was prepared using Conservation officer, historic buildings and may include unintentional dictation errors.  MRN: [id number] DOB: February 19, 2001  Subjective:   Scott Sloan is a 22 y.o. male presenting for 5-day history of acute onset persistent coughing, sinus congestion, sinus pressure and pain, throat pain, throat closing sensation, throat swelling.  Patient has a history of asthma.  Also has a history of allergies.  Has previously had tympanostomy tube placement.  Patient does smoke.  Would like a COVID and flu test.  No current facility-administered medications for this encounter.  Current Outpatient Medications:    methocarbamol  (ROBAXIN ) 750 MG tablet, Take 750 mg by mouth 4 (four) times daily., Disp: , Rfl:    aspirin-acetaminophen -caffeine (EXCEDRIN  MIGRAINE) 250-250-65 MG tablet, Take 1 tablet by mouth every 6 (six) hours as needed for headache., Disp: 30 tablet, Rfl: 0   doxycycline  (VIBRAMYCIN ) 100 MG capsule, Take 1 capsule (100 mg total) by mouth 2 (two) times daily., Disp: 14 capsule, Rfl: 0   fluticasone  (FLONASE ) 50 MCG/ACT nasal spray, Place 2 sprays into both nostrils daily., Disp: 16 g, Rfl: 12   levocetirizine (XYZAL ) 5 MG tablet, Take 1 tablet (5 mg total) by mouth every evening., Disp: 30 tablet, Rfl: 0   loperamide  (IMODIUM ) 2 MG capsule, Take 1 capsule (2 mg total) by mouth 2 (two) times daily as needed for diarrhea or loose stools., Disp: 14 capsule, Rfl: 0   naproxen  (NAPROSYN ) 500 MG tablet, Take 1 tablet (500 mg total) by mouth 2 (two) times daily with a meal., Disp: 30 tablet, Rfl: 0   ondansetron  (ZOFRAN -ODT) 8 MG disintegrating tablet, Take 1 tablet (8 mg total) by mouth every 8 (eight) hours as needed for nausea or vomiting., Disp: 20 tablet, Rfl: 0   pseudoephedrine  (SUDAFED) 30 MG tablet, Take 1 tablet (30 mg total) by mouth every 8 (eight) hours as needed for congestion., Disp: 30 tablet, Rfl: 0    tiZANidine  (ZANAFLEX ) 4 MG tablet, Take 1 tablet (4 mg total) by mouth at bedtime., Disp: 30 tablet, Rfl: 0   Allergies  Allergen Reactions   Amoxicillin  Hives    Has patient had a PCN reaction causing immediate rash, facial/tongue/throat swelling, SOB or lightheadedness with hypotension:Unknown Has patient had a PCN reaction causing severe rash involving mucus membranes or skin necrosis: Unknown Has patient had a PCN reaction that required hospitalization:Unknown Has patient had a PCN reaction occurring within the last 10 years: Unknown If all of the above answers are NO, then may proceed with Cephalosporin use.     Past Medical History:  Diagnosis Date   Asthma      Past Surgical History:  Procedure Laterality Date   COSMETIC SURGERY     from dog bite   EAR TUBE REMOVAL     hand tumor     TONSILLECTOMY     TYMPANOSTOMY TUBE PLACEMENT      Family History  Problem Relation Age of Onset   Heart attack Mother    CVA Mother    Diabetes type II Mother    Hypertension Mother    Pneumonia Father     Social History   Tobacco Use   Smoking status: Some Days    Types: Cigars   Smokeless tobacco: Never  Vaping Use   Vaping status: Never Used  Substance Use Topics   Alcohol use: Yes    Comment: occasionally   Drug use: Not Currently    Types:  Marijuana    ROS   Objective:   Vitals: BP 115/74 (BP Location: Right Arm)   Pulse 93   Temp 99 F (37.2 C) (Oral)   Resp 15   SpO2 95%   Physical Exam Constitutional:      General: He is not in acute distress.    Appearance: Normal appearance. He is well-developed and normal weight. He is not ill-appearing, toxic-appearing or diaphoretic.  HENT:     Head: Normocephalic and atraumatic.     Right Ear: Tympanic membrane, ear canal and external ear normal. No drainage, swelling or tenderness. No middle ear effusion. There is no impacted cerumen. Tympanic membrane is not erythematous or bulging.     Left Ear: Tympanic  membrane, ear canal and external ear normal. No drainage, swelling or tenderness.  No middle ear effusion. There is no impacted cerumen. Tympanic membrane is not erythematous or bulging.     Nose: Nose normal. No congestion or rhinorrhea.     Mouth/Throat:     Mouth: Mucous membranes are moist.     Pharynx: No oropharyngeal exudate or posterior oropharyngeal erythema.  Eyes:     General: No scleral icterus.       Right eye: No discharge.        Left eye: No discharge.     Extraocular Movements: Extraocular movements intact.     Conjunctiva/sclera: Conjunctivae normal.  Cardiovascular:     Rate and Rhythm: Normal rate and regular rhythm.     Heart sounds: Normal heart sounds. No murmur heard.    No friction rub. No gallop.  Pulmonary:     Effort: Pulmonary effort is normal. No respiratory distress.     Breath sounds: Normal breath sounds. No stridor. No wheezing, rhonchi or rales.  Musculoskeletal:     Cervical back: Normal range of motion and neck supple. No rigidity. No muscular tenderness.  Neurological:     General: No focal deficit present.     Mental Status: He is alert and oriented to person, place, and time.  Psychiatric:        Mood and Affect: Mood normal.        Behavior: Behavior normal.        Thought Content: Thought content normal.     Results for orders placed or performed during the hospital encounter of 04/29/23 (from the past 24 hours)  POC Covid19/Flu A&B Antigen     Status: None   Collection Time: 04/29/23 10:09 AM  Result Value Ref Range   Influenza A Antigen, POC Negative Negative   Influenza B Antigen, POC Negative Negative   Covid Antigen, POC Negative Negative    Assessment and Plan :   PDMP not reviewed this encounter.  1. Viral upper respiratory infection   2. History of asthma    Given significant respiratory symptoms, recommend Zyrtec , prednisone , general supportive care.  Deferred imaging given clear cardiopulmonary exam, hemodynamically  stable vital signs.  Counseled patient on potential for adverse effects with medications prescribed/recommended today, ER and return-to-clinic precautions discussed, patient verbalized understanding.    Christopher Savannah, PA-C 04/29/23 1010    Christopher Savannah, [place] 04/29/23 1026

## 2023-04-29 NOTE — Discharge Instructions (Signed)
 We will manage this as a viral upper respiratory infection. For sore throat or cough try using a honey-based tea. Use 3 teaspoons of honey with juice squeezed from half lemon. Place shaved pieces of ginger into 1/2-1 cup of water and warm over stove top. Then mix the ingredients and repeat every 4 hours as needed. Please take Tylenol  500mg -650mg  once every 6 hours for fevers, aches and pains. Hydrate very well with at least 2 liters (64 ounces) of water. Eat light meals such as soups (chicken and noodles, chicken wild rice, vegetable).  Do not eat any foods that you are allergic to.  Start an antihistamine like Zyrtec  (10mg  daily) for postnasal drainage, sinus congestion.  You can take this together with prednisone  and cough syrup.

## 2023-04-29 NOTE — ED Triage Notes (Signed)
Pt c/o cough, head congestion, sinus pain/pressure, sore throat, HA-sx stared 12/26-NAD-steady gait
